# Patient Record
Sex: Female | Born: 1970 | Hispanic: Yes | Marital: Married | Smoking: Never smoker
Health system: Southern US, Community
[De-identification: ages and names within clinical notes are randomized; demographics above are authoritative.]

## PROBLEM LIST (undated history)

## (undated) DIAGNOSIS — E119 Type 2 diabetes mellitus without complications: Secondary | ICD-10-CM

## (undated) DIAGNOSIS — D332 Benign neoplasm of brain, unspecified: Secondary | ICD-10-CM

## (undated) HISTORY — PX: CHOLECYSTECTOMY: SHX55

---

## 2009-02-25 ENCOUNTER — Emergency Department: Payer: Self-pay | Admitting: Emergency Medicine

## 2009-12-14 ENCOUNTER — Emergency Department: Payer: Self-pay | Admitting: Emergency Medicine

## 2010-01-04 ENCOUNTER — Ambulatory Visit: Payer: Self-pay

## 2010-12-14 ENCOUNTER — Observation Stay: Payer: Self-pay | Admitting: Internal Medicine

## 2011-05-02 ENCOUNTER — Emergency Department: Payer: Self-pay | Admitting: Emergency Medicine

## 2011-05-02 LAB — COMPREHENSIVE METABOLIC PANEL
BUN: 14 mg/dL (ref 7–18)
Bilirubin,Total: 0.3 mg/dL (ref 0.2–1.0)
Chloride: 100 mmol/L (ref 98–107)
Creatinine: 0.7 mg/dL (ref 0.60–1.30)
EGFR (African American): >60
Osmolality: 286 (ref 275–301)
Potassium: 4 mmol/L (ref 3.5–5.1)
SGOT(AST): 23 U/L (ref 15–37)
SGPT (ALT): 25 U/L
Sodium: 139 mmol/L (ref 136–145)
Total Protein: 8.1 g/dL (ref 6.4–8.2)

## 2011-05-02 LAB — URINALYSIS, COMPLETE
Bilirubin,UR: NEGATIVE
Glucose,UR: >=500 mg/dL (ref 0–75)
Ph: 6 (ref 4.5–8.0)
Specific Gravity: 1.023 (ref 1.003–1.030)

## 2011-05-02 LAB — CBC
Platelet: 226 10*3/uL (ref 150–440)
RBC: 5 10*6/uL (ref 3.80–5.20)
RDW: 13.5 % (ref 11.5–14.5)
WBC: 8.7 10*3/uL (ref 3.6–11.0)

## 2011-05-02 LAB — WET PREP, GENITAL

## 2011-07-12 ENCOUNTER — Emergency Department: Payer: Self-pay | Admitting: *Deleted

## 2011-07-12 LAB — COMPREHENSIVE METABOLIC PANEL
Alkaline Phosphatase: 104 U/L (ref 50–136)
Anion Gap: 12 (ref 7–16)
Bilirubin,Total: 0.2 mg/dL (ref 0.2–1.0)
Chloride: 107 mmol/L (ref 98–107)
Creatinine: 0.68 mg/dL (ref 0.60–1.30)
EGFR (African American): >60
Glucose: 237 mg/dL — ABNORMAL HIGH (ref 65–99)
Potassium: 3.9 mmol/L (ref 3.5–5.1)
SGOT(AST): 23 U/L (ref 15–37)
SGPT (ALT): 26 U/L
Sodium: 139 mmol/L (ref 136–145)

## 2011-07-12 LAB — CBC WITH DIFFERENTIAL/PLATELET
Basophil #: 0 10*3/uL (ref 0.0–0.1)
Basophil %: 0.2 %
Eosinophil #: 0.1 10*3/uL (ref 0.0–0.7)
Eosinophil %: 1 %
HCT: 37.4 % (ref 35.0–47.0)
HGB: 12.7 g/dL (ref 12.0–16.0)
Lymphocyte #: 2.4 10*3/uL (ref 1.0–3.6)
Lymphocyte %: 34.1 %
MCH: 28.2 pg (ref 26.0–34.0)
MCHC: 34.1 g/dL (ref 32.0–36.0)
MCV: 83 fL (ref 80–100)
Monocyte #: 0.5 10*3/uL (ref 0.0–0.7)
Monocyte %: 7.8 %
Neutrophil #: 4 10*3/uL (ref 1.4–6.5)
Neutrophil %: 56.9 %
Platelet: 208 10*3/uL (ref 150–440)
RBC: 4.52 10*6/uL (ref 3.80–5.20)
RDW: 13.8 % (ref 11.5–14.5)
WBC: 7 10*3/uL (ref 3.6–11.0)

## 2011-07-12 LAB — URINALYSIS, COMPLETE
Bilirubin,UR: NEGATIVE
Glucose,UR: >=500 mg/dL (ref 0–75)
Leukocyte Esterase: NEGATIVE
Nitrite: NEGATIVE
Ph: 5 (ref 4.5–8.0)
RBC,UR: 1 /HPF (ref 0–5)
Specific Gravity: 1.03 (ref 1.003–1.030)
WBC UR: 3 /HPF (ref 0–5)

## 2011-10-12 ENCOUNTER — Emergency Department: Payer: Self-pay | Admitting: Emergency Medicine

## 2012-01-11 ENCOUNTER — Ambulatory Visit: Payer: Self-pay | Admitting: Internal Medicine

## 2012-02-04 ENCOUNTER — Ambulatory Visit: Payer: Self-pay | Admitting: Neurology

## 2012-02-11 ENCOUNTER — Ambulatory Visit: Payer: Self-pay | Admitting: Neurology

## 2012-02-19 ENCOUNTER — Ambulatory Visit: Payer: Self-pay | Admitting: Urology

## 2012-10-02 ENCOUNTER — Emergency Department: Payer: Self-pay | Admitting: Emergency Medicine

## 2012-10-04 ENCOUNTER — Emergency Department: Payer: Self-pay | Admitting: Emergency Medicine

## 2013-03-28 ENCOUNTER — Emergency Department: Payer: Self-pay | Admitting: Emergency Medicine

## 2013-03-28 LAB — COMPREHENSIVE METABOLIC PANEL
Albumin: 3.3 g/dL — ABNORMAL LOW (ref 3.4–5.0)
Bilirubin,Total: 0.2 mg/dL (ref 0.2–1.0)
Chloride: 105 mmol/L (ref 98–107)
Co2: 25 mmol/L (ref 21–32)
Creatinine: 0.68 mg/dL (ref 0.60–1.30)
EGFR (African American): >60
Osmolality: 277 (ref 275–301)
SGOT(AST): 15 U/L (ref 15–37)
SGPT (ALT): 28 U/L (ref 12–78)
Sodium: 136 mmol/L (ref 136–145)
Total Protein: 7.7 g/dL (ref 6.4–8.2)

## 2013-03-28 LAB — URINALYSIS, COMPLETE
Bilirubin,UR: NEGATIVE
Glucose,UR: 50 mg/dL (ref 0–75)
Leukocyte Esterase: NEGATIVE
Nitrite: NEGATIVE
Protein: NEGATIVE
RBC,UR: NONE SEEN /HPF (ref 0–5)
Squamous Epithelial: 1

## 2013-03-28 LAB — CBC WITH DIFFERENTIAL/PLATELET
Basophil #: 0 10*3/uL (ref 0.0–0.1)
Basophil %: 0.4 %
Eosinophil #: 0.1 10*3/uL (ref 0.0–0.7)
Eosinophil %: 1 %
Lymphocyte #: 2.6 10*3/uL (ref 1.0–3.6)
MCH: 24.8 pg — ABNORMAL LOW (ref 26.0–34.0)
MCV: 77 fL — ABNORMAL LOW (ref 80–100)
Monocyte %: 7.1 %
Neutrophil #: 4.6 10*3/uL (ref 1.4–6.5)
Neutrophil %: 58.3 %
Platelet: 265 10*3/uL (ref 150–440)
RDW: 15.5 % — ABNORMAL HIGH (ref 11.5–14.5)

## 2013-04-01 ENCOUNTER — Emergency Department: Payer: Self-pay | Admitting: Emergency Medicine

## 2013-04-01 LAB — CBC WITH DIFFERENTIAL/PLATELET
Basophil #: 0 10*3/uL (ref 0.0–0.1)
HGB: 11.9 g/dL — ABNORMAL LOW (ref 12.0–16.0)
Lymphocyte #: 3.3 10*3/uL (ref 1.0–3.6)
MCV: 77 fL — ABNORMAL LOW (ref 80–100)
Monocyte #: 0.5 x10 3/mm (ref 0.2–0.9)
Neutrophil #: 4.3 10*3/uL (ref 1.4–6.5)
Platelet: 280 10*3/uL (ref 150–440)
RBC: 4.68 10*6/uL (ref 3.80–5.20)
RDW: 15.3 % — ABNORMAL HIGH (ref 11.5–14.5)
WBC: 8.2 10*3/uL (ref 3.6–11.0)

## 2013-04-01 LAB — COMPREHENSIVE METABOLIC PANEL
Albumin: 3.3 g/dL — ABNORMAL LOW (ref 3.4–5.0)
Alkaline Phosphatase: 109 U/L
Calcium, Total: 9.1 mg/dL (ref 8.5–10.1)
Chloride: 102 mmol/L (ref 98–107)
Co2: 24 mmol/L (ref 21–32)
Creatinine: 0.72 mg/dL (ref 0.60–1.30)
EGFR (African American): >60
Glucose: 234 mg/dL — ABNORMAL HIGH (ref 65–99)
Potassium: 3.9 mmol/L (ref 3.5–5.1)
SGOT(AST): 24 U/L (ref 15–37)
SGPT (ALT): 34 U/L (ref 12–78)
Sodium: 133 mmol/L — ABNORMAL LOW (ref 136–145)
Total Protein: 8 g/dL (ref 6.4–8.2)

## 2013-04-01 LAB — URINALYSIS, COMPLETE
Leukocyte Esterase: NEGATIVE
Nitrite: NEGATIVE
Protein: NEGATIVE
RBC,UR: 1 /HPF (ref 0–5)
Specific Gravity: 1.014 (ref 1.003–1.030)
Squamous Epithelial: 1
WBC UR: 2 /HPF (ref 0–5)

## 2013-04-01 LAB — LIPASE, BLOOD: Lipase: 122 U/L (ref 73–393)

## 2013-07-22 ENCOUNTER — Emergency Department: Payer: Self-pay | Admitting: Emergency Medicine

## 2013-07-22 LAB — BASIC METABOLIC PANEL
Anion Gap: 8 (ref 7–16)
BUN: 12 mg/dL (ref 7–18)
CALCIUM: 9.3 mg/dL (ref 8.5–10.1)
CHLORIDE: 102 mmol/L (ref 98–107)
CO2: 25 mmol/L (ref 21–32)
Creatinine: 0.68 mg/dL (ref 0.60–1.30)
EGFR (African American): >60
EGFR (Non-African Amer.): >60
Glucose: 170 mg/dL — ABNORMAL HIGH (ref 65–99)
Osmolality: 274 (ref 275–301)
Potassium: 4.1 mmol/L (ref 3.5–5.1)
Sodium: 135 mmol/L — ABNORMAL LOW (ref 136–145)

## 2013-07-22 LAB — CBC
HCT: 35.9 % (ref 35.0–47.0)
HGB: 11.7 g/dL — AB (ref 12.0–16.0)
MCH: 25.4 pg — ABNORMAL LOW (ref 26.0–34.0)
MCHC: 32.5 g/dL (ref 32.0–36.0)
MCV: 78 fL — ABNORMAL LOW (ref 80–100)
Platelet: 275 10*3/uL (ref 150–440)
RBC: 4.59 10*6/uL (ref 3.80–5.20)
RDW: 15.2 % — AB (ref 11.5–14.5)
WBC: 8.5 10*3/uL (ref 3.6–11.0)

## 2013-07-22 LAB — TROPONIN I: Troponin-I: <0.02 ng/mL

## 2013-07-22 LAB — PRO B NATRIURETIC PEPTIDE: B-TYPE NATIURETIC PEPTID: 12 pg/mL (ref 0–125)

## 2013-09-13 ENCOUNTER — Emergency Department: Payer: Self-pay | Admitting: Emergency Medicine

## 2013-09-13 LAB — COMPREHENSIVE METABOLIC PANEL
AST: 22 U/L (ref 15–37)
Albumin: 3.3 g/dL — ABNORMAL LOW (ref 3.4–5.0)
Alkaline Phosphatase: 105 U/L
Anion Gap: 5 — ABNORMAL LOW (ref 7–16)
BUN: 6 mg/dL — AB (ref 7–18)
Bilirubin,Total: 0.2 mg/dL (ref 0.2–1.0)
CHLORIDE: 102 mmol/L (ref 98–107)
Calcium, Total: 8.6 mg/dL (ref 8.5–10.1)
Co2: 27 mmol/L (ref 21–32)
Creatinine: 0.55 mg/dL — ABNORMAL LOW (ref 0.60–1.30)
Glucose: 279 mg/dL — ABNORMAL HIGH (ref 65–99)
Osmolality: 276 (ref 275–301)
Potassium: 3.6 mmol/L (ref 3.5–5.1)
SGPT (ALT): 29 U/L (ref 12–78)
Sodium: 134 mmol/L — ABNORMAL LOW (ref 136–145)
TOTAL PROTEIN: 7.8 g/dL (ref 6.4–8.2)

## 2013-09-13 LAB — CBC WITH DIFFERENTIAL/PLATELET
Basophil #: 0 10*3/uL (ref 0.0–0.1)
Basophil %: 0.4 %
EOS ABS: 0.1 10*3/uL (ref 0.0–0.7)
Eosinophil %: 0.5 %
HCT: 35.8 % (ref 35.0–47.0)
HGB: 11.5 g/dL — ABNORMAL LOW (ref 12.0–16.0)
Lymphocyte #: 2.7 10*3/uL (ref 1.0–3.6)
Lymphocyte %: 25.3 %
MCH: 25.1 pg — AB (ref 26.0–34.0)
MCHC: 32.2 g/dL (ref 32.0–36.0)
MCV: 78 fL — ABNORMAL LOW (ref 80–100)
Monocyte #: 0.8 x10 3/mm (ref 0.2–0.9)
Monocyte %: 7.6 %
Neutrophil #: 7.2 10*3/uL — ABNORMAL HIGH (ref 1.4–6.5)
Neutrophil %: 66.2 %
Platelet: 282 10*3/uL (ref 150–440)
RBC: 4.59 10*6/uL (ref 3.80–5.20)
RDW: 15.7 % — ABNORMAL HIGH (ref 11.5–14.5)
WBC: 10.8 10*3/uL (ref 3.6–11.0)

## 2013-09-13 LAB — URINALYSIS, COMPLETE
BACTERIA: NONE SEEN
Bilirubin,UR: NEGATIVE
Blood: NEGATIVE
Glucose,UR: >=500 mg/dL (ref 0–75)
Leukocyte Esterase: NEGATIVE
Nitrite: NEGATIVE
PH: 5 (ref 4.5–8.0)
Protein: NEGATIVE
RBC,UR: <1 /HPF (ref 0–5)
SPECIFIC GRAVITY: 1.027 (ref 1.003–1.030)

## 2013-09-13 LAB — LIPASE, BLOOD: LIPASE: 197 U/L (ref 73–393)

## 2013-09-14 LAB — GC/CHLAMYDIA PROBE AMP

## 2013-11-01 ENCOUNTER — Emergency Department: Payer: Self-pay | Admitting: Emergency Medicine

## 2013-11-01 LAB — CBC WITH DIFFERENTIAL/PLATELET
Basophil #: 0 10*3/uL (ref 0.0–0.1)
Basophil %: 0.3 %
EOS PCT: 0.1 %
Eosinophil #: 0 10*3/uL (ref 0.0–0.7)
HCT: 37.6 % (ref 35.0–47.0)
HGB: 12.1 g/dL (ref 12.0–16.0)
Lymphocyte #: 0.9 10*3/uL — ABNORMAL LOW (ref 1.0–3.6)
Lymphocyte %: 12.4 %
MCH: 24.7 pg — AB (ref 26.0–34.0)
MCHC: 32.1 g/dL (ref 32.0–36.0)
MCV: 77 fL — ABNORMAL LOW (ref 80–100)
MONOS PCT: 9.2 %
Monocyte #: 0.7 x10 3/mm (ref 0.2–0.9)
NEUTROS ABS: 5.9 10*3/uL (ref 1.4–6.5)
Neutrophil %: 78 %
Platelet: 242 10*3/uL (ref 150–440)
RBC: 4.9 10*6/uL (ref 3.80–5.20)
RDW: 15.6 % — ABNORMAL HIGH (ref 11.5–14.5)
WBC: 7.6 10*3/uL (ref 3.6–11.0)

## 2013-11-01 LAB — URINALYSIS, COMPLETE
Bilirubin,UR: NEGATIVE
GLUCOSE, UR: NEGATIVE mg/dL (ref 0–75)
Leukocyte Esterase: NEGATIVE
Nitrite: NEGATIVE
Ph: 6 (ref 4.5–8.0)
Protein: 30
Specific Gravity: 1.018 (ref 1.003–1.030)
WBC UR: 4 /HPF (ref 0–5)

## 2013-11-01 LAB — COMPREHENSIVE METABOLIC PANEL
AST: 27 U/L (ref 15–37)
Albumin: 3 g/dL — ABNORMAL LOW (ref 3.4–5.0)
Alkaline Phosphatase: 97 U/L
Anion Gap: 12 (ref 7–16)
BUN: 8 mg/dL (ref 7–18)
Bilirubin,Total: 0.3 mg/dL (ref 0.2–1.0)
CALCIUM: 9.4 mg/dL (ref 8.5–10.1)
CO2: 21 mmol/L (ref 21–32)
CREATININE: 0.74 mg/dL (ref 0.60–1.30)
Chloride: 101 mmol/L (ref 98–107)
EGFR (Non-African Amer.): >60
Glucose: 180 mg/dL — ABNORMAL HIGH (ref 65–99)
OSMOLALITY: 271 (ref 275–301)
POTASSIUM: 3.1 mmol/L — AB (ref 3.5–5.1)
SGPT (ALT): 29 U/L
Sodium: 134 mmol/L — ABNORMAL LOW (ref 136–145)
Total Protein: 8.3 g/dL — ABNORMAL HIGH (ref 6.4–8.2)

## 2013-11-01 LAB — LIPASE, BLOOD: Lipase: 139 U/L (ref 73–393)

## 2013-11-02 ENCOUNTER — Observation Stay: Payer: Self-pay | Admitting: Internal Medicine

## 2013-11-02 LAB — COMPREHENSIVE METABOLIC PANEL
AST: 52 U/L — AB (ref 15–37)
Albumin: 2.8 g/dL — ABNORMAL LOW (ref 3.4–5.0)
Alkaline Phosphatase: 176 U/L — ABNORMAL HIGH
Anion Gap: 10 (ref 7–16)
BILIRUBIN TOTAL: 0.2 mg/dL (ref 0.2–1.0)
BUN: 8 mg/dL (ref 7–18)
CO2: 23 mmol/L (ref 21–32)
Calcium, Total: 8.9 mg/dL (ref 8.5–10.1)
Chloride: 102 mmol/L (ref 98–107)
Creatinine: 0.66 mg/dL (ref 0.60–1.30)
EGFR (Non-African Amer.): >60
GLUCOSE: 193 mg/dL — AB (ref 65–99)
OSMOLALITY: 274 (ref 275–301)
POTASSIUM: 3.3 mmol/L — AB (ref 3.5–5.1)
SGPT (ALT): 75 U/L — ABNORMAL HIGH
Sodium: 135 mmol/L — ABNORMAL LOW (ref 136–145)
Total Protein: 7.9 g/dL (ref 6.4–8.2)

## 2013-11-02 LAB — CBC WITH DIFFERENTIAL/PLATELET
Basophil #: 0 10*3/uL (ref 0.0–0.1)
Basophil %: 0.4 %
EOS PCT: 0.6 %
Eosinophil #: 0 10*3/uL (ref 0.0–0.7)
HCT: 35.5 % (ref 35.0–47.0)
HGB: 11.4 g/dL — ABNORMAL LOW (ref 12.0–16.0)
LYMPHS ABS: 1.1 10*3/uL (ref 1.0–3.6)
Lymphocyte %: 17.2 %
MCH: 24.8 pg — ABNORMAL LOW (ref 26.0–34.0)
MCHC: 32.2 g/dL (ref 32.0–36.0)
MCV: 77 fL — AB (ref 80–100)
MONOS PCT: 15.4 %
Monocyte #: 1 x10 3/mm — ABNORMAL HIGH (ref 0.2–0.9)
Neutrophil #: 4.3 10*3/uL (ref 1.4–6.5)
Neutrophil %: 66.4 %
Platelet: 253 10*3/uL (ref 150–440)
RBC: 4.59 10*6/uL (ref 3.80–5.20)
RDW: 15.4 % — ABNORMAL HIGH (ref 11.5–14.5)
WBC: 6.4 10*3/uL (ref 3.6–11.0)

## 2013-11-03 LAB — URINALYSIS, COMPLETE
BACTERIA: NONE SEEN
BLOOD: NEGATIVE
Bilirubin,UR: NEGATIVE
Glucose,UR: NEGATIVE mg/dL (ref 0–75)
LEUKOCYTE ESTERASE: NEGATIVE
NITRITE: NEGATIVE
Ph: 6 (ref 4.5–8.0)
Protein: NEGATIVE
RBC,UR: 1 /HPF (ref 0–5)
SPECIFIC GRAVITY: 1.014 (ref 1.003–1.030)
Squamous Epithelial: 1

## 2013-11-03 LAB — BASIC METABOLIC PANEL
Anion Gap: 8 (ref 7–16)
BUN: 7 mg/dL (ref 7–18)
CHLORIDE: 107 mmol/L (ref 98–107)
CREATININE: 0.72 mg/dL (ref 0.60–1.30)
Calcium, Total: 8.4 mg/dL — ABNORMAL LOW (ref 8.5–10.1)
Co2: 23 mmol/L (ref 21–32)
EGFR (African American): >60
Glucose: 148 mg/dL — ABNORMAL HIGH (ref 65–99)
OSMOLALITY: 276 (ref 275–301)
POTASSIUM: 3.1 mmol/L — AB (ref 3.5–5.1)
Sodium: 138 mmol/L (ref 136–145)

## 2013-11-03 LAB — CBC WITH DIFFERENTIAL/PLATELET
Basophil #: 0 10*3/uL (ref 0.0–0.1)
Basophil %: 0.4 %
Eosinophil #: 0 10*3/uL (ref 0.0–0.7)
Eosinophil %: 0.7 %
HCT: 31 % — AB (ref 35.0–47.0)
HGB: 10.1 g/dL — ABNORMAL LOW (ref 12.0–16.0)
LYMPHS PCT: 33.2 %
Lymphocyte #: 1.5 10*3/uL (ref 1.0–3.6)
MCH: 24.6 pg — AB (ref 26.0–34.0)
MCHC: 32.5 g/dL (ref 32.0–36.0)
MCV: 76 fL — AB (ref 80–100)
MONOS PCT: 20.2 %
Monocyte #: 0.9 x10 3/mm (ref 0.2–0.9)
NEUTROS ABS: 2.1 10*3/uL (ref 1.4–6.5)
NEUTROS PCT: 45.5 %
PLATELETS: 221 10*3/uL (ref 150–440)
RBC: 4.09 10*6/uL (ref 3.80–5.20)
RDW: 15.5 % — AB (ref 11.5–14.5)
WBC: 4.5 10*3/uL (ref 3.6–11.0)

## 2014-02-21 ENCOUNTER — Emergency Department: Payer: Self-pay | Admitting: Emergency Medicine

## 2014-03-19 ENCOUNTER — Emergency Department: Payer: Self-pay | Admitting: Emergency Medicine

## 2014-03-19 LAB — COMPREHENSIVE METABOLIC PANEL
ALK PHOS: 102 U/L
ANION GAP: 8 (ref 7–16)
Albumin: 2.9 g/dL — ABNORMAL LOW (ref 3.4–5.0)
BUN: 8 mg/dL (ref 7–18)
Bilirubin,Total: 0.1 mg/dL — ABNORMAL LOW (ref 0.2–1.0)
Calcium, Total: 8.7 mg/dL (ref 8.5–10.1)
Chloride: 107 mmol/L (ref 98–107)
Co2: 23 mmol/L (ref 21–32)
Creatinine: 0.77 mg/dL (ref 0.60–1.30)
EGFR (African American): >60
EGFR (Non-African Amer.): >60
Glucose: 191 mg/dL — ABNORMAL HIGH (ref 65–99)
Osmolality: 279 (ref 275–301)
Potassium: 4.1 mmol/L (ref 3.5–5.1)
SGOT(AST): 23 U/L (ref 15–37)
SGPT (ALT): 31 U/L
SODIUM: 138 mmol/L (ref 136–145)
Total Protein: 7.4 g/dL (ref 6.4–8.2)

## 2014-03-19 LAB — CBC
HCT: 32.9 % — AB (ref 35.0–47.0)
HGB: 10.2 g/dL — ABNORMAL LOW (ref 12.0–16.0)
MCH: 23.1 pg — AB (ref 26.0–34.0)
MCHC: 31 g/dL — AB (ref 32.0–36.0)
MCV: 74 fL — AB (ref 80–100)
Platelet: 282 10*3/uL (ref 150–440)
RBC: 4.42 10*6/uL (ref 3.80–5.20)
RDW: 16.1 % — AB (ref 11.5–14.5)
WBC: 10.7 10*3/uL (ref 3.6–11.0)

## 2014-03-19 LAB — URINALYSIS, COMPLETE
BLOOD: NEGATIVE
Bilirubin,UR: NEGATIVE
GLUCOSE, UR: NEGATIVE mg/dL (ref 0–75)
KETONE: NEGATIVE
Leukocyte Esterase: NEGATIVE
NITRITE: NEGATIVE
Ph: 5 (ref 4.5–8.0)
Protein: NEGATIVE
RBC,UR: NONE SEEN /HPF (ref 0–5)
Specific Gravity: 1.021 (ref 1.003–1.030)
Squamous Epithelial: 15
WBC UR: NONE SEEN /HPF (ref 0–5)

## 2014-04-13 ENCOUNTER — Emergency Department: Payer: Self-pay | Admitting: Emergency Medicine

## 2014-04-13 LAB — CBC
HCT: 35.7 % (ref 35.0–47.0)
HGB: 11.2 g/dL — AB (ref 12.0–16.0)
MCH: 22.8 pg — ABNORMAL LOW (ref 26.0–34.0)
MCHC: 31.3 g/dL — ABNORMAL LOW (ref 32.0–36.0)
MCV: 73 fL — AB (ref 80–100)
Platelet: 306 10*3/uL (ref 150–440)
RBC: 4.9 10*6/uL (ref 3.80–5.20)
RDW: 16.4 % — ABNORMAL HIGH (ref 11.5–14.5)
WBC: 9.5 10*3/uL (ref 3.6–11.0)

## 2014-04-13 LAB — COMPREHENSIVE METABOLIC PANEL
ALK PHOS: 119 U/L — AB
ALT: 30 U/L
AST: 18 U/L (ref 15–37)
Albumin: 3.6 g/dL (ref 3.4–5.0)
Anion Gap: 9 (ref 7–16)
BUN: 10 mg/dL (ref 7–18)
Bilirubin,Total: 0.1 mg/dL — ABNORMAL LOW (ref 0.2–1.0)
CREATININE: 0.65 mg/dL (ref 0.60–1.30)
Calcium, Total: 8.9 mg/dL (ref 8.5–10.1)
Chloride: 103 mmol/L (ref 98–107)
Co2: 25 mmol/L (ref 21–32)
EGFR (African American): >60
EGFR (Non-African Amer.): >60
GLUCOSE: 150 mg/dL — AB (ref 65–99)
Osmolality: 276 (ref 275–301)
Potassium: 3.5 mmol/L (ref 3.5–5.1)
Sodium: 137 mmol/L (ref 136–145)
Total Protein: 8.5 g/dL — ABNORMAL HIGH (ref 6.4–8.2)

## 2014-04-13 LAB — LIPASE, BLOOD: Lipase: 195 U/L (ref 73–393)

## 2014-04-13 LAB — HCG, QUANTITATIVE, PREGNANCY: Beta Hcg, Quant.: <1 m[IU]/mL — ABNORMAL LOW

## 2014-04-14 LAB — URINALYSIS, COMPLETE
Bacteria: NONE SEEN
Bilirubin,UR: NEGATIVE
Blood: NEGATIVE
Glucose,UR: NEGATIVE mg/dL (ref 0–75)
Ketone: NEGATIVE
Leukocyte Esterase: NEGATIVE
NITRITE: NEGATIVE
Ph: 5 (ref 4.5–8.0)
Protein: NEGATIVE
RBC,UR: 2 /HPF (ref 0–5)
SPECIFIC GRAVITY: 1.016 (ref 1.003–1.030)
Squamous Epithelial: 1
WBC UR: 1 /HPF (ref 0–5)

## 2014-07-14 ENCOUNTER — Emergency Department
Admit: 2014-07-14 | Disposition: A | Discharge status: Home / Self Care | Payer: Self-pay | Admitting: Emergency Medicine

## 2014-07-14 LAB — URINALYSIS, COMPLETE
BLOOD: NEGATIVE
Bacteria: NONE SEEN
Bilirubin,UR: NEGATIVE
Glucose,UR: NEGATIVE mg/dL (ref 0–75)
Ketone: NEGATIVE
Leukocyte Esterase: NEGATIVE
NITRITE: NEGATIVE
Ph: 5 (ref 4.5–8.0)
Protein: NEGATIVE
SPECIFIC GRAVITY: 1.016 (ref 1.003–1.030)

## 2014-07-14 LAB — CBC WITH DIFFERENTIAL/PLATELET
BASOS PCT: 0.3 %
Basophil #: 0 10*3/uL (ref 0.0–0.1)
EOS PCT: 1.3 %
Eosinophil #: 0.1 10*3/uL (ref 0.0–0.7)
HCT: 31.2 % — ABNORMAL LOW (ref 35.0–47.0)
HGB: 9.5 g/dL — AB (ref 12.0–16.0)
LYMPHS ABS: 3 10*3/uL (ref 1.0–3.6)
Lymphocyte %: 34.7 %
MCH: 21.2 pg — ABNORMAL LOW (ref 26.0–34.0)
MCHC: 30.5 g/dL — ABNORMAL LOW (ref 32.0–36.0)
MCV: 70 fL — ABNORMAL LOW (ref 80–100)
MONO ABS: 0.7 x10 3/mm (ref 0.2–0.9)
Monocyte %: 8.2 %
Neutrophil #: 4.8 10*3/uL (ref 1.4–6.5)
Neutrophil %: 55.5 %
Platelet: 324 10*3/uL (ref 150–440)
RBC: 4.49 10*6/uL (ref 3.80–5.20)
RDW: 17.1 % — ABNORMAL HIGH (ref 11.5–14.5)
WBC: 8.7 10*3/uL (ref 3.6–11.0)

## 2014-07-14 LAB — COMPREHENSIVE METABOLIC PANEL
ALBUMIN: 3.4 g/dL — AB
ALK PHOS: 90 U/L
AST: 24 U/L
Anion Gap: 9 (ref 7–16)
BUN: 10 mg/dL
Bilirubin,Total: 0.5 mg/dL
CHLORIDE: 101 mmol/L
Calcium, Total: 9.3 mg/dL
Co2: 25 mmol/L
Creatinine: 0.62 mg/dL
EGFR (African American): >60
EGFR (Non-African Amer.): >60
Glucose: 189 mg/dL — ABNORMAL HIGH
POTASSIUM: 3.9 mmol/L
SGPT (ALT): 21 U/L
Sodium: 135 mmol/L
Total Protein: 7.6 g/dL

## 2014-07-14 LAB — PROTIME-INR
INR: 1
Prothrombin Time: 12.9 secs

## 2014-07-30 NOTE — H&P (Signed)
PATIENT NAME:  Tina Blackburn MR#:  [id number] DATE OF BIRTH:  08/20/1970  DATE OF ADMISSION:  11/02/2013  PRIMARY CARE PHYSICIAN: Dr. Benjamine Mola Arru.    REFERRING PHYSICIAN:  Vivien Presto, Physician Assistant.   CHIEF COMPLAINT: Nausea, vomiting and diarrhea, bloody stools.   HISTORY OF PRESENT ILLNESS:  [name] is a 44 year old Hispanic female, Spanish only speaking, history of diabetes mellitus, comes to the Emergency Department with complaints of nausea and vomiting for the last 5 days. The patient states had multiple episodes of vomiting. Since Friday, only has been nauseated; did not have any episodes of vomiting. The patient states has been having multiple episodes of diarrhea. Had some subjective fevers. Concerning this, came to the Emergency Department 2 days back. CT abdomen and pelvis showed mild colitis. The patient was discharged home with Cipro and Flagyl. Did not have any elevated white blood cell count. Did not have any signs of any dehydration. The patient states unable to keep down any of the food or the pills. Concerning this, came back to the Emergency Department. The patient also states that has been having bloody diarrhea since this morning. Denies having any sick contacts, eating anywhere outside or leftover food. Stool occult was positive, however, more of brownish stools.   PAST MEDICAL HISTORY: 1.  Diabetes mellitus.  2.  Kidney stones.   PAST SURGICAL HISTORY:  Cholecystectomy.   ALLERGIES: No known drug allergies.   HOME MEDICATIONS:  None.   SOCIAL HISTORY:  No history of smoking, drinking alcohol or using illicit drugs. Married, lives with her husband.   FAMILY HISTORY:  Diabetes mellitus.  REVIEW OF SYSTEMS: CONSTITUTIONAL:  Experiencing generalized weakness.  EYES:  No change in vision.  EAR, NOSE, THROAT:  No change in her hearing.  RESPIRATORY:  No cough, shortness of breath.  CARDIOVASCULAR:  No chest pain, palpations.   GASTROINTESTINAL:  Has nausea, vomiting, diarrhea.  GENITOURINARY:  No dysuria or hematuria.  HEMATOLOGIC:  No easy bruising or bleeding.  SKIN:  No rash or lesions.  MUSCULOSKELETAL:  No joint pains and aches.  NEUROLOGIC:  No weakness or numbness in any part of the body.   PHYSICAL EXAMINATION: GENERAL:  This is a well-built, well-nourished, age-appropriate female lying down in the bed, not in distress.  VITAL SIGNS:  Temperature 98.5, pulse 99, blood pressure 111/89, respiratory rate of 16, oxygen saturation 100% on room air.  HEENT:  Head normocephalic, atraumatic. Eyes: No scleral icterus. Conjunctivae normal. Pupils equal and react to light. Extraocular movements are intact. Mucous membranes moist. No pharyngeal erythema.  NECK:  Supple. No lymphadenopathy. No JVD. No carotid bruit.  CHEST:  No focal tenderness.  LUNGS:  Bilateral clear to auscultation.  HEART:  S1, S2 regular. No murmurs are heard.  ABDOMEN:  Bowel sounds present. Soft, nontender, nondistended. Could not appreciate any hepatosplenomegaly.  EXTREMITIES:  No pedal edema. Pulses 2+.  SKIN:  No rash or lesions.  MUSCULOSKELETAL:  Good range of motion in all the extremities.  NEUROLOGIC:  The patient is alert, oriented to place, person and time. Cranial nerves II through XII intact. Motor 5/5 in upper and lower extremities.   LABORATORY DATA: CBC is completely within normal limits. CMP: Glucose 180, potassium 3.1. Has mild elevation of the LFTs with alk phos of 176, ALT 75, AST 52. UA negative for nitrites and leukocyte esterase.   CT abdomen and pelvis with contrast shows suspected mild colitis involving the descending and sigmoid colon, prominent liver with fatty liver  change. Gallbladder absent.   ASSESSMENT AND PLAN: [name] is a 44 year old female who comes to the Emergency Department with nausea, vomiting and diarrhea.  1.  Gastroenteritis. Keep the patient n.p.o. Continue with IV fluids with potassium and  have a close followup. We will also keep the patient on Cipro and Flagyl considering the patient's mild colitis.  2.  Mildly elevated liver enzymes, which is a change from the previous labs done 2 days back. Repeat the CMP tomorrow and follow up.  If continues to be elevated, consider getting a hepatitis panel.  3.  Diabetes mellitus. The patient has blood sugars of 193. The patient may benefit starting on metformin, considering the patient's obesity and fatty liver.  4.  Keep the patient on deep vein thrombosis prophylaxis with sequential compression devices.   TIME SPENT: 50 minutes.     ____________________________ Monica Becton, MD pv:dmm D: 11/02/2013 22:17:00 ET T: 11/02/2013 23:04:25 ET JOB#: [id number]  cc: Monica Becton, MD, <Dictator> Dani Gobble. Elon Jester Elodia Haviland MD ELECTRONICALLY SIGNED 11/11/2013 21:10

## 2014-07-30 NOTE — Discharge Summary (Signed)
PATIENT NAME:  Tina Blackburn, Tina Blackburn MR#:  [id number] DATE OF BIRTH:  1970-08-31  DATE OF ADMISSION:  11/02/2013 DATE OF DISCHARGE:  11/04/2013  PRESENTING COMPLAINT: Nausea, vomiting and bloody stools.   DISCHARGE DIAGNOSES: 1.  Acute colitis.  2.  Type 2 diabetes.  3.  Morbid obesity.   CODE STATUS: FULL.  DISCHARGE MEDICATIONS: 1.  Cipro 500 mg b.i.d.  2.  Flagyl 500 mg t.i.d.  3.  Metformin 1,000 mg b.i.d.  4.  Januvia 100 mg daily.   DISCHARGE DIET: Carbohydrate-controlled.  DISCHARGE INSTRUCTIONS: Follow up with your primary care physician at the [facility].   DIAGNOSTIC DATA: White count is 4.5. Creatinine is 0.72.   CT of the abdomen and pelvis with contrast showed suspect mild colitis involving descending and sigmoid colon. No abscess was seen  UA positive for mild UTI.  BRIEF SUMMARY OF HOSPITAL COURSE: Tina Blackburn is an obese 44 year old Hispanic female with history of diabetes who came in with nausea, vomiting and bloody stools. She was admitted with:  1.  Acute colitis. Received IV fluids and placed on IV Cipro and Flagyl. She was tolerating p.o. soft diet. No abdominal pain at discharge. She will finish up a 10 day course of antibiotics.  2.  Mild elevated liver enzymes, which have been unchanged from labs done 2 days back. She had her gallbladder removed in the past. CT of abdomen showed fatty liver. The patient did not have any symptoms related to that.  3.  Type 2 diabetes. The patient was resumed back on her Januvia and metformin at discharge.  Hospital stay otherwise remained stable. She remained a FULL code.   TIME SPENT: 40 minutes.  ____________________________ [name], MD sap:sb D: 11/05/2013 07:58:11 ET T: 11/05/2013 09:51:19 ET JOB#: [id number]  cc: Amada Hallisey A. Posey Pronto, MD, <Dictator> Ilda Basset MD ELECTRONICALLY SIGNED 11/17/2013 11:38

## 2014-08-16 NOTE — H&P (Signed)
L&D Evaluation:  History Expanded:  HPI 44 yo female with several month history of upper abdominal pain, worse the past two days.  The pain started in her upper abdomen and then spread to her lower abdomen, but did not leave her epigastric area.  Notably, she is being treated for infectious gastritis.  She has an IUD in place,which was placed seven months ago.  This was placed due to irregular vaginal bleeding.  She will have periods that last for a couple of weeks at a time, then go away and return a few days later.  She is not having vaginal bleeding today.  She states that she is nauseated, but isn't vomiting. She has had long-standing diarrhea of unknown origin and states that she is having rectal bleeding recently.  Her pain is made worse by eating. Nothing makes it better. She is taking clarithromycin and amoxicillin for gastritis.  She is also taking omeprazole and maloox for her reflux symptoms.  She strongly desires  to have her IUD removed.   Patient's Medical History 1) gastritis, 2) GERD, 3) Type 2 diabetes 4) obesity   Patient's Surgical History laparoscopic cholecystectomy   Medications 1) metformin 1 gram po BID, 2) clarithromycin 527m PO BID, 3) Amoxicillin 1,0042mPO BID, 4) omeprazole 4037mO daily, 5) Januvia 100m32m daily   Allergies NKDA   Social History denies tobacco, EtOH, and illicit drugs   Family History Non-Contributory   ROS:  ROS All systems were reviewed.  HEENT, CNS, GI, GU, Respiratory, CV, Renal and Musculoskeletal systems were found to be normal., unless otherwise noted in her HPI   Exam:  Vital Signs Afebrile, P 88, BP 121/57, RR20   General Obese, NAD   Mental Status clear   Chest clear   Heart normal sinus rhythm   Abdomen obese, soft, tender to palpation throughout (mild), no rebound or guarding   Back no CVAT   Edema no edema   Pelvic normal external female genitalia, vaginal normally rugated with scant blood in the vaginal vault,  cervix is normal in appearance, she does have moderate cervical motion tenderness, her body habitus limits evaluation of her uterine contour and adnexa.  Her IUD strings are easily visible emerging from the cervical os.   Skin no lesions   Other Pelvic U/S and abdominal/pelvic CT scan shows small fibroids, no other structural abnormalities, no free fluid. IUD located in the lower uterine segment with concern for arm of IUD extending into the myometrium.   Impression:  Impression 1) abdominal pain, acute, 2) malpositioned IUD   Plan:  Comments 1) IUD removed without difficulty 2) treat for PID with rocephine 250mg76mx 1, doxycycline 100mg 29mid x 2 weeks. [facility] bleeding. 4) her current issues appear to be related to her gastritis/GI issues.  A CT scan in 03/2013 showed the IUD in a similar position. So, the IUD location has not changed and is unlikely to account for any acute pain.   Labs:  Lab Results: Hepatic:  08-Jun-15 14:00   Bilirubin, Total 0.2  Alkaline Phosphatase 105 (45-117 NOTE: New Reference Range 02/26/13)  SGPT (ALT) 29  SGOT (AST) 22  Total Protein, Serum 7.8  Albumin, Serum  3.3  Routine Chem:  08-Jun-15 14:00   Lipase 197 (Result(s) reported on 13 Sep 2013 at 03:28PM.)  Glucose, Serum  279  BUN  6  Creatinine (comp)  0.55  Potassium, Serum 3.6  Chloride, Serum 102  CO2, Serum 27  Calcium (Total), Serum 8.6  Osmolality (calc) 276  eGFR (African American) >60  eGFR (Non-African American) >60 (eGFR values <28m/min/1.73 m2 may be an indication of chronic kidney disease (CKD). Calculated eGFR is useful in patients with stable renal function. The eGFR calculation will not be reliable in acutely ill patients when serum creatinine is changing rapidly. It is not useful in  patients on dialysis. The eGFR calculation may not be applicable to patients at the low and high extremes of body  sizes, pregnant women, and vegetarians.)  Anion Gap  5  Routine UA:  08-Jun-15 14:00   Color (UA) Yellow  Clarity (UA) Clear  Glucose (UA) >=500  Bilirubin (UA) Negative  Ketones (UA) Trace  Specific Gravity (UA) 1.027  Blood (UA) Negative  pH (UA) 5.0  Protein (UA) Negative  Nitrite (UA) Negative  Leukocyte Esterase (UA) Negative (Result(s) reported on 13 Sep 2013 at 02:55PM.)  RBC (UA) <1 /HPF  WBC (UA) 1 /HPF  Bacteria (UA) NONE SEEN  Epithelial Cells (UA) 2 /HPF  Mucous (UA) PRESENT (Result(s) reported on 13 Sep 2013 at 02:55PM.)  Routine Hem:  08-Jun-15 14:00   WBC (CBC) 10.8  RBC (CBC) 4.59  Hemoglobin (CBC)  11.5  Hematocrit (CBC) 35.8  Platelet Count (CBC) 282  MCV  78  MCH  25.1  MCHC 32.2  RDW  15.7  Neutrophil % 66.2  Lymphocyte % 25.3  Monocyte % 7.6  Eosinophil % 0.5  Basophil % 0.4  Neutrophil #  7.2  Lymphocyte # 2.7  Monocyte # 0.8  Eosinophil # 0.1  Basophil # 0.0 (Result(s) reported on 13 Sep 2013 at 02:30PM.)   Electronic Signatures: JWill Bonnet(MD)  (Signed 08-Jun-15 22:59)  Authored: L&D Evaluation, Labs   Last Updated: 08-Jun-15 22:59 by JWill Bonnet(MD)

## 2014-12-18 ENCOUNTER — Encounter: Payer: Self-pay | Admitting: *Deleted

## 2014-12-18 ENCOUNTER — Emergency Department
Admission: EM | Admit: 2014-12-18 | Discharge: 2014-12-19 | Disposition: A | Discharge status: Home / Self Care | Payer: Self-pay | Attending: Emergency Medicine | Admitting: Emergency Medicine

## 2014-12-18 DIAGNOSIS — Z79899 Other long term (current) drug therapy: Secondary | ICD-10-CM | POA: Insufficient documentation

## 2014-12-18 DIAGNOSIS — R109 Unspecified abdominal pain: Secondary | ICD-10-CM | POA: Insufficient documentation

## 2014-12-18 DIAGNOSIS — E119 Type 2 diabetes mellitus without complications: Secondary | ICD-10-CM | POA: Insufficient documentation

## 2014-12-18 DIAGNOSIS — R309 Painful micturition, unspecified: Secondary | ICD-10-CM | POA: Insufficient documentation

## 2014-12-18 DIAGNOSIS — Z3202 Encounter for pregnancy test, result negative: Secondary | ICD-10-CM | POA: Insufficient documentation

## 2014-12-18 DIAGNOSIS — R319 Hematuria, unspecified: Secondary | ICD-10-CM | POA: Insufficient documentation

## 2014-12-18 HISTORY — DX: Type 2 diabetes mellitus without complications: E11.9

## 2014-12-18 LAB — URINALYSIS COMPLETE WITH MICROSCOPIC (ARMC ONLY)
BILIRUBIN URINE: NEGATIVE
Glucose, UA: >500 mg/dL — AB
KETONES UR: NEGATIVE mg/dL
LEUKOCYTES UA: NEGATIVE
NITRITE: NEGATIVE
PH: 6 (ref 5.0–8.0)
PROTEIN: NEGATIVE mg/dL
Specific Gravity, Urine: 1.025 (ref 1.005–1.030)

## 2014-12-18 LAB — POCT PREGNANCY, URINE: Preg Test, Ur: NEGATIVE

## 2014-12-18 NOTE — ED Notes (Signed)
Pt has low abd pain for 3 days.  Pt has nausea.  states blood in urine.  No fever.  Pt has low back pain.

## 2014-12-18 NOTE — ED Notes (Signed)
Pt c/o dysuria x 2 days. Pt states hematuria starting at 2100 tonight. Pt c/o nausea and diarrhea x 2 days, denies vomiting. Pt denies fever, endorses mid-back pain x 2 days.

## 2014-12-19 ENCOUNTER — Emergency Department: Payer: Self-pay

## 2014-12-19 LAB — CBC
HCT: 32.1 % — ABNORMAL LOW (ref 35.0–47.0)
Hemoglobin: 9.9 g/dL — ABNORMAL LOW (ref 12.0–16.0)
MCH: 20.2 pg — AB (ref 26.0–34.0)
MCHC: 30.8 g/dL — AB (ref 32.0–36.0)
MCV: 65.4 fL — ABNORMAL LOW (ref 80.0–100.0)
PLATELETS: 285 10*3/uL (ref 150–440)
RBC: 4.91 MIL/uL (ref 3.80–5.20)
RDW: 18.1 % — ABNORMAL HIGH (ref 11.5–14.5)
WBC: 8.6 10*3/uL (ref 3.6–11.0)

## 2014-12-19 LAB — BASIC METABOLIC PANEL
Anion gap: 8 (ref 5–15)
BUN: 10 mg/dL (ref 6–20)
CALCIUM: 9.4 mg/dL (ref 8.9–10.3)
CO2: 25 mmol/L (ref 22–32)
CREATININE: 0.55 mg/dL (ref 0.44–1.00)
Chloride: 101 mmol/L (ref 101–111)
Glucose, Bld: 332 mg/dL — ABNORMAL HIGH (ref 65–99)
Potassium: 4.1 mmol/L (ref 3.5–5.1)
Sodium: 134 mmol/L — ABNORMAL LOW (ref 135–145)

## 2014-12-19 MED ORDER — TRAMADOL HCL 50 MG PO TABS
50.0000 mg | ORAL_TABLET | Freq: Once | ORAL | Status: AC
  Administered 2014-12-19: 50 mg via ORAL

## 2014-12-19 MED ORDER — TRAMADOL HCL 50 MG PO TABS: 50.0000 mg | ORAL_TABLET | Freq: Once | ORAL | Status: DC

## 2014-12-19 MED ORDER — TRAMADOL HCL 50 MG PO TABS: 50.0000 mg | ORAL_TABLET | Freq: Four times a day (QID) | ORAL | Status: DC | PRN

## 2014-12-19 MED ORDER — MORPHINE SULFATE (PF) 4 MG/ML IV SOLN
4.0000 mg | Freq: Once | INTRAVENOUS | Status: AC
  Administered 2014-12-19: 4 mg via INTRAVENOUS
  Filled 2014-12-19: qty 1

## 2014-12-19 MED ORDER — CEPHALEXIN 500 MG PO CAPS: 500.0000 mg | ORAL_CAPSULE | Freq: Four times a day (QID) | ORAL | Status: AC

## 2014-12-19 MED ORDER — KETOROLAC TROMETHAMINE 30 MG/ML IJ SOLN
30.0000 mg | Freq: Once | INTRAMUSCULAR | Status: AC
  Administered 2014-12-19: 30 mg via INTRAVENOUS
  Filled 2014-12-19: qty 1

## 2014-12-19 MED ORDER — TRAMADOL HCL 50 MG PO TABS
ORAL_TABLET | ORAL | Status: AC
  Administered 2014-12-19: 50 mg via ORAL
  Filled 2014-12-19: qty 1

## 2014-12-19 MED ORDER — ONDANSETRON HCL 4 MG/2ML IJ SOLN
4.0000 mg | Freq: Once | INTRAMUSCULAR | Status: AC
  Administered 2014-12-19: 4 mg via INTRAVENOUS
  Filled 2014-12-19: qty 2

## 2014-12-19 MED ORDER — SODIUM CHLORIDE 0.9 % IV BOLUS (SEPSIS)
1000.0000 mL | Freq: Once | INTRAVENOUS | Status: AC
  Administered 2014-12-19: 1000 mL via INTRAVENOUS

## 2014-12-19 NOTE — Discharge Instructions (Signed)
Dolor abdominal (Abdominal Pain) El dolor puede tener muchas causas. Normalmente la causa del dolor abdominal no es una enfermedad y Teacher, English as a foreign language sin Clinical research associate. Frecuentemente puede controlarse y tratarse en casa. Su mdico le Chartered certified accountant examen fsico y posiblemente solicite anlisis de sangre y radiografas para ayudar a Teacher, adult education la gravedad de su dolor. Sin embargo, en [employer], debe transcurrir ms tiempo antes de que se pueda Pension scheme manager una causa evidente del dolor. Antes de llegar a ese punto, es posible que su mdico no sepa si necesita ms pruebas o un tratamiento ms profundo. INSTRUCCIONES PARA EL CUIDADO EN EL HOGAR  Est atento al dolor para ver si hay cambios. Las siguientes indicaciones ayudarn a Chief Strategy Officer que pueda sentir:  [place] solo medicamentos de venta libre o recetados, segn las indicaciones del mdico.  No tome laxantes a menos que se lo haya indicado su mdico.  Pruebe con Ardelia Mems dieta lquida absoluta (caldo, t o agua) segn se lo indique su mdico. Introduzca gradualmente una dieta normal, segn su tolerancia. SOLICITE ATENCIN MDICA SI:  Tiene dolor abdominal sin explicacin.  Tiene dolor abdominal relacionado con nuseas o diarrea.  Tiene dolor cuando orina o defeca.  Experimenta dolor abdominal que lo despierta de noche.  Tiene dolor abdominal que empeora o mejora cuando come alimentos.  Tiene dolor abdominal que empeora cuando come alimentos grasosos.  Tiene fiebre. SOLICITE ATENCIN MDICA DE INMEDIATO SI:   El dolor no desaparece en un plazo mximo de 2horas.  No deja de (vomitar).  El Social research officer, government se siente solo en partes del abdomen, como el lado derecho o la parte inferior izquierda del abdomen.  Evaca materia fecal sanguinolenta o negra, de aspecto alquitranado. ASEGRESE DE QUE:  Comprende estas instrucciones.  Controlar su afeccin.  Recibir ayuda de inmediato si no mejora o si empeora. Document Released: 03/25/2005  Document Revised: 03/30/2013 [facility] Patient Information 2015 [place]. This information is not intended to replace advice given to you by your health care provider. Make sure you discuss any questions you have with your health care provider.  Disuria (Dysuria) Es el trmino que se aplica al trastorno de dolor al [employer]. Hay muchas causas de disuria, pero la ms frecuente es la infeccin del tracto urinario. Un anlisis de [place] puede confirmar si tiene una infeccin. Un cultivo de [place] 2 y 3 das. El cultivo de [place] confirma que usted o el nio estn enfermos. Deber concurrir a una visita de control debido a que:  Si le realizaron un cultivo, Civil Service fast streamer y las recomendaciones para Dispensing optician.  Si el cultivo de [place] fue positivo, deber tomar antibiticos o conocer si los antibiticos que le han prescripto son los correctos para su tipo de infeccin.  Si el cultivo es negativo (no hay infeccin del tracto urinario, debern buscar otras causas o habr que suspender los antibiticos. Puede ser que en el da de hoy le hayan hecho anlisis de laboratorio y no se haya hallado infeccin. Si se realizaron Chief of Staff 24 y 80 horas en [employer]. Puede ser que en el da de hoy le hayan tomado radiografas cuyo resultado es normal. No se ha hallado la causa del problema. Las radiografas sern reledas por un radilogo, que se comunicar con usted si encuentra algn resultado adicional. Puede ser que en el da de hoy a usted o a su nio le hayan indicado medicamentos para ayudarlo con su problema hasta que vea al mdico de cabecera. Si mejora, podr  consultar con su mdico de cabecera si reapareciera. Si se le han administrado antibiticos (medicamentos que [employer] grmenes), tmelos como se le han indicado Animator. Si se le han realizado anlisis de Designer, fashion/clothing, Careers information officer. Deje un  nmero telefnico para poder contactarlo. Si esto no es posible, averige cmo debe [employer]. INSTRUCCIONES PARA EL CUIDADO DOMICILIARIO  Beba gran cantidad de lquidos. Para adultos, beba 8 vasos de agua o jugo por [employer]. Para nios, reponga los lquidos como le indique su mdico.  Vacie la vejiga con frecuencia. Evite retener la orina durante largos perodos.  Despus de [employer], las mujeres deben limpiarse desde adelante hacia atrs, usando el papel higinico slo una vez.  Vace la vejiga antes y despus de Clinical biochemist.  Tome todos los medicamentos que le han recetado hasta que la infeccin haya desaparecido. Se sentir mejor en [employer], pero debe tomar TODOS LOS MEDICAMENTOS. Si se le ha administrado Pyridium, problemente la orina sea de un color oscuro. Esto puede hacer que su ropa interior se manche por lo que deber Risk manager una Psychologist, sport and exercise.  Evite la cafena, el t, el alcohol y las bebidas carbonatadas, debido a que tienden a Medical illustrator.  En los hombres, el alcohol puede irritar la prstata.  Utilice los medicamentos de venta libre o de prescripcin para Conservation officer, historic buildings, Health and safety inspector o la [place], segn se lo indique el profesional que lo asiste.  Si el profesional que lo [employer] pide que concurra a una cita de seguimiento, es importante asistir a ella. No concurrir a la Multimedia programmer como consecuencia una lesin crnica o [place], dolor, e incapacidad. Si tiene algn problema para asistir a la cita, debe comunicarse con el establecimiento para obtener asistencia. SOLICITE ATENCIN MDICA DE INMEDIATO SI:  Siente dolor en la espalda.  Sube la fiebre.  Si tiene nuseas (ganas de vomitar) o vmitos.  Si el problema no mejora con los medicamentos o [place]. EST SEGURO QUE:  Comprende las instrucciones para el alta mdica.  Controlar su enfermedad.  Solicitar atencin mdica de inmediato segn las  indicaciones. Document Released: 04/14/2007 Document Revised: 06/17/2011 [facility] Patient Information 2015 [place]. This information is not intended to replace advice given to you by your health care provider. Make sure you discuss any questions you have with your health care provider.  Hematuria (Hematuria) La hematuria es la presencia de sangre en la orina. La causa puede ser una infeccin en la vejiga, en los riones, en la prstata, clculos renales o cncer de las vas urinarias. Normalmente las infecciones pueden tratarse con medicamentos, y los clculos renales, por lo general, se eliminarn a travs de la orina. Si ninguna de estas es la causa de su hematuria, quizs se necesiten ms estudios para Education officer, community. Es muy importante que le informe a su mdico si ve sangre en la [place], aunque el sangrado se detenga sin tratamiento o no cause dolor. La sangre que aparece en la orina y luego se detiene y vuelve a aparecer nuevamente puede ser un sntoma de una enfermedad muy grave. Adems, el dolor no es un sntoma en las etapas iniciales de muchos tipos de cncer urinarios. INSTRUCCIONES PARA EL CUIDADO EN EL HOGAR   Beba mucho lquido, entre 3 a Software engineer. Si le han diagnosticado una infeccin, se recomienda especialmente el jugo de arndanos rojos, adems de grandes cantidades de [place].  Evite la cafena, el t y las bebidas con  gas porque suelen irritar la vejiga.  Evite el alcohol ya que puede Engineer, manufacturing.  Tome todos los [employer] se lo haya indicado el mdico.  Si le recetaron antibiticos, asegrese de terminarlos, incluso si comienza a sentirse mejor.  Si le han diagnosticado clculos renales, siga las instrucciones de su mdico con respecto a Astronomer la orina para Research scientist (medical) clculo.  Vace la vejiga con frecuencia. Evite retener la orina durante largos perodos.  Despus de defecar, las mujeres deben higienizarse desde adelante hacia atrs.  Use solo un papel por vez.  Si es [employer], vace la vejiga antes y despus de Clinical biochemist. SOLICITE ATENCIN MDICA SI:  Siente dolor en la espalda.  Tiene fiebre.  Tiene sensacin de Higher education careers adviser (nuseas) o vmitos.  Los sntomas no mejoran despus de 3 das. Si la condicin empeora, visite al mdico antes de lo previsto. SOLICITE ATENCIN MDICA DE INMEDIATO SI:   Vomita con frecuencia e intensamente y no puede tolerar los medicamentos.  Siente un dolor intenso en la espalda o el abdomen incluso tomando los medicamentos.  Elimina cogulos grandes o sangre con la [place].  Se siente extremadamente dbil, se desmaya o pierde el conocimiento. ASEGRESE DE QUE:   Comprende estas instrucciones.  Controlar su afeccin.  Recibir ayuda de inmediato si no mejora o si empeora. Document Released: 03/25/2005 Document Revised: 08/09/2013 [facility] Patient Information 2015 [place], [place]. This information is not intended to replace advice given to you by your health care provider. Make sure you discuss any questions you have with your health care provider.  Dolor en el flanco  (Flank Pain) El dolor en el flanco se refiere a aquel dolor que se localiza en un lado del cuerpo, entre la zona superior del abdomen y Counsellor. Puede aparecer en un perodo corto de tiempo (agudo) o puede durar mucho tiempo o ser recurrente (crnico). Puede ser leve o intenso. Puede tener diferentes causas.  CAUSAS  Algunas de las causas ms comunes son:   Esguince muscular.   Espasmos musculares.   Problemas en la columna vertebral (enfermedad de los discos).   Infeccin en el pulmn (neumona).   Lquido en los pulmones (edema pulmonar).   Infeccin renal.   Piedras en el rin.   Una erupcin cutnea muy dolorosa causada por el virus de la varicela (herpes zoster).  Enfermedad de la vescula biliar. INSTRUCCIONES PARA EL CUIDADO EN EL HOGAR  Los cuidados en el  hogar dependern de la causa del dolor. En general:   Haga reposo segn las indicaciones del mdico.  Debe ingerir gran cantidad de lquido para mantener la orina de tono claro o color amarillo plido.  Tome slo medicamentos de venta libre o recetados, segn las indicaciones del mdico. Algunos medicamentos ayudan a Best boy.  Informe al mdico si tiene algn Risk manager.  Concurra a las visitas de control, segn las indicaciones. SOLICITE ATENCIN MDICA DE INMEDIATO SI:   El dolor no se alivia con los [employer].   Tiene sntomas nuevos o que empeoran.  El dolor [place].   Siente dolor abdominal.   Le falta el aire.   Tiene nuseas o vmitos persistentes.   El abdomen se hincha.   Sufre mareos o se desmaya.   Observa sangre en la orina.  Tiene fiebre o sntomas persistentes durante ms de 2  3 das.  Tiene fiebre y los sntomas empeoran repentinamente. ASEGRESE DE QUE:   Comprende estas instrucciones.  Controlar su enfermedad.  Solicitar ayuda de inmediato si no mejora o si empeora. Document Released: 07/02/2007 Document Revised: 12/18/2011 [facility] Patient Information 2015 [place]. This information is not intended to replace advice given to you by your health care provider. Make sure you discuss any questions you have with your health care provider.

## 2014-12-19 NOTE — ED Notes (Signed)
States pain is not much better.  Iv fluids infusing.

## 2014-12-19 NOTE — ED Notes (Signed)
Pt states pain improved

## 2014-12-19 NOTE — ED Provider Notes (Signed)
[facility] Emergency Department Provider Note  ____________________________________________  Time seen: Approximately 0000 AM  I have reviewed the triage vital signs and the nursing notes.   HISTORY  Chief Complaint Dysuria    HPI Tina Blackburn is a 44 y.o. female who comes into the hospital with low-back pain radiating to her abdomen and genitals. The patient's spouse reports that tonight she went to urinate and it was bloody. The patient reports that she has had similar symptoms with back pain 6 months ago and was told that she had a kidney stone. She reports though that the pain is on both sides. The patient reports she with no vomiting. The pain is sharp and 8 out of 10 in intensity. The patient also reports that it hurts when she urinates. She is tried taking Tylenol for pain and her last menstrual period was 12/08/2014.   Past Medical History  Diagnosis Date  . Diabetes mellitus without complication     There are no active problems to display for this patient.   Past Surgical History  Procedure Laterality Date  . Cholecystectomy      Current Outpatient Rx  Name  Route  Sig  Dispense  Refill  . metFORMIN (GLUCOPHAGE) 1000 MG tablet   Oral   Take 1,000 mg by mouth 2 (two) times daily with a meal.         . omeprazole (PRILOSEC) 40 MG capsule   Oral   Take 40 mg by mouth daily.         . sitaGLIPtin (JANUVIA) 25 MG tablet   Oral   Take 25 mg by mouth daily.         . cephALEXin (KEFLEX) 500 MG capsule   Oral   Take 1 capsule (500 mg total) by mouth 4 (four) times daily.   20 capsule   0   . traMADol (ULTRAM) 50 MG tablet   Oral   Take 1 tablet (50 mg total) by mouth every 6 (six) hours as needed.   12 tablet   0     Allergies Review of patient's allergies indicates no known allergies.  History reviewed. No pertinent family history.  Social History Social History  Substance Use Topics  . Smoking status:  Never Smoker   . Smokeless tobacco: Never Used  . Alcohol Use: No    Review of Systems Constitutional: No fever/chills Eyes: No visual changes. ENT: No sore throat. Cardiovascular: Denies chest pain. Respiratory: Denies shortness of breath. Gastrointestinal: Abdominal pain and nausea with no vomiting.  No diarrhea.  No constipation. Genitourinary:  dysuria. Musculoskeletal: Flank pain Skin: Negative for rash. Neurological: Negative for headaches, focal weakness or numbness.  10-point ROS otherwise negative.  ____________________________________________   PHYSICAL EXAM:  VITAL SIGNS: ED Triage Vitals  Enc Vitals Group     BP 12/19/14 0126 106/69 mmHg     Pulse Rate 12/19/14 0126 84     Resp 12/19/14 0126 20     Temp --      Temp src --      SpO2 12/19/14 0126 99 %     Weight --      Height --      Head Cir --      Peak Flow --      Pain Score 12/18/14 2238 7     Pain Loc --      Pain Edu? --      Excl. in [place]? --     Constitutional:  Alert and oriented. Well appearing and in moderate distress. Eyes: Conjunctivae are normal. PERRL. EOMI. Head: Atraumatic. Nose: No congestion/rhinnorhea. Mouth/Throat: Mucous membranes are moist.  Oropharynx non-erythematous. Cardiovascular: Normal rate, regular rhythm. Grossly normal heart sounds.  Good peripheral circulation. Respiratory: Normal respiratory effort.  No retractions. Lungs CTAB. Gastrointestinal: Soft and nontender. No distention. Positive bowel sounds with right sided CVA tenderness to palpation Musculoskeletal: No lower extremity tenderness nor edema.   Neurologic:  Normal speech and language.  Skin:  Skin is warm, dry and intact.  Psychiatric: Mood and affect are normal.   ____________________________________________   LABS (all labs ordered are listed, but only abnormal results are displayed)  Labs Reviewed  URINALYSIS COMPLETEWITH MICROSCOPIC ([place]) - Abnormal; Notable for the following:    Color,  Urine YELLOW (*)    APPearance CLEAR (*)    Glucose, UA >500 (*)    Hgb urine dipstick 1+ (*)    Bacteria, UA FEW (*)    Squamous Epithelial / LPF 0-5 (*)    All other components within normal limits  BASIC METABOLIC PANEL - Abnormal; Notable for the following:    Sodium 134 (*)    Glucose, Bld 332 (*)    All other components within normal limits  CBC - Abnormal; Notable for the following:    Hemoglobin 9.9 (*)    HCT 32.1 (*)    MCV 65.4 (*)    MCH 20.2 (*)    MCHC 30.8 (*)    RDW 18.1 (*)    All other components within normal limits  POC URINE PREG, ED  POCT PREGNANCY, URINE   ____________________________________________  EKG  None ____________________________________________  RADIOLOGY  CT renal stone study: Unchanged punctate nonobstructing left nephrolithiasis, no acute abnormality in the abdomen or pelvis.  ____________________________________________   PROCEDURES  Procedure(s) performed: None  Critical Care performed: No  ____________________________________________   INITIAL IMPRESSION / ASSESSMENT AND PLAN / ED COURSE  Pertinent labs & imaging results that were available during my care of the patient were reviewed by me and considered in my medical decision making (see chart for details).  This is a 44 year old female who comes in with flank pain that started yesterday. The patient does have a history of kidney stones. The patient's urine does not show any significant amount of blood at this time but I will do a CT scan as the patient has had kidney stones in the past. I will give the patient a dose of morphine, Toradol and a liter of normal saline. I will reassess the patient once I received the results of her CT scan.  The patient reports that she still did have some pain but that it was improved from previous. The patient did not want any further pain medicine but was feeling hungry. At this time the patient's blood work is fairly unremarkable. I  discussed the results with the patient and will discharge her to home with some antibiotics given her hematuria as well as her dysuria and have her follow-up with her primary care physician. ____________________________________________   FINAL CLINICAL IMPRESSION(S) / ED DIAGNOSES  Final diagnoses:  Right flank pain  Pain with urination  Hematuria      Loney Hering, MD 12/19/14 0401

## 2015-03-19 ENCOUNTER — Encounter: Payer: Self-pay | Admitting: Emergency Medicine

## 2015-03-19 ENCOUNTER — Emergency Department
Admission: EM | Admit: 2015-03-19 | Discharge: 2015-03-20 | Disposition: A | Discharge status: Home / Self Care | Payer: Self-pay | Attending: Emergency Medicine | Admitting: Emergency Medicine

## 2015-03-19 DIAGNOSIS — H538 Other visual disturbances: Secondary | ICD-10-CM | POA: Insufficient documentation

## 2015-03-19 DIAGNOSIS — R000 Tachycardia, unspecified: Secondary | ICD-10-CM | POA: Insufficient documentation

## 2015-03-19 DIAGNOSIS — R1030 Lower abdominal pain, unspecified: Secondary | ICD-10-CM | POA: Insufficient documentation

## 2015-03-19 DIAGNOSIS — E119 Type 2 diabetes mellitus without complications: Secondary | ICD-10-CM | POA: Insufficient documentation

## 2015-03-19 DIAGNOSIS — R51 Headache: Secondary | ICD-10-CM | POA: Insufficient documentation

## 2015-03-19 DIAGNOSIS — Z7984 Long term (current) use of oral hypoglycemic drugs: Secondary | ICD-10-CM | POA: Insufficient documentation

## 2015-03-19 DIAGNOSIS — Z79899 Other long term (current) drug therapy: Secondary | ICD-10-CM | POA: Insufficient documentation

## 2015-03-19 DIAGNOSIS — R110 Nausea: Secondary | ICD-10-CM | POA: Insufficient documentation

## 2015-03-19 DIAGNOSIS — G252 Other specified forms of tremor: Secondary | ICD-10-CM

## 2015-03-19 DIAGNOSIS — G8929 Other chronic pain: Secondary | ICD-10-CM | POA: Insufficient documentation

## 2015-03-19 DIAGNOSIS — F419 Anxiety disorder, unspecified: Secondary | ICD-10-CM | POA: Insufficient documentation

## 2015-03-19 DIAGNOSIS — G4700 Insomnia, unspecified: Secondary | ICD-10-CM | POA: Insufficient documentation

## 2015-03-19 MED ORDER — LORAZEPAM 1 MG PO TABS
1.0000 mg | ORAL_TABLET | Freq: Once | ORAL | Status: AC
  Administered 2015-03-19: 1 mg via ORAL

## 2015-03-19 MED ORDER — LORAZEPAM 1 MG PO TABS
ORAL_TABLET | ORAL | Status: AC
  Filled 2015-03-19: qty 1

## 2015-03-19 NOTE — ED Notes (Signed)
Pt arrived via EMS with hands and arms shaking uncontrollably.  Interpreter present during questioning.  Per pt has hx of a "brain tumor" but her daughter has all the details, pt is unaware of prognosis.  She is complaint with metformin she says, glu in EMS was 200.  Pt states she got into an argument tonight with her husband and her shaking got much worse and id what prompted the call to EMS.

## 2015-03-20 ENCOUNTER — Emergency Department: Payer: Self-pay

## 2015-03-20 LAB — COMPREHENSIVE METABOLIC PANEL
ALBUMIN: 3.5 g/dL (ref 3.5–5.0)
ALK PHOS: 90 U/L (ref 38–126)
ALT: 25 U/L (ref 14–54)
AST: 25 U/L (ref 15–41)
Anion gap: 6 (ref 5–15)
BILIRUBIN TOTAL: 0.3 mg/dL (ref 0.3–1.2)
BUN: 12 mg/dL (ref 6–20)
CO2: 24 mmol/L (ref 22–32)
Calcium: 9.1 mg/dL (ref 8.9–10.3)
Chloride: 108 mmol/L (ref 101–111)
Creatinine, Ser: 0.44 mg/dL (ref 0.44–1.00)
GFR calc Af Amer: >60 mL/min (ref >60)
GFR calc non Af Amer: >60 mL/min (ref >60)
GLUCOSE: 189 mg/dL — AB (ref 65–99)
POTASSIUM: 3.7 mmol/L (ref 3.5–5.1)
Sodium: 138 mmol/L (ref 135–145)
TOTAL PROTEIN: 7.7 g/dL (ref 6.5–8.1)

## 2015-03-20 LAB — CBC
HEMATOCRIT: 30.8 % — AB (ref 35.0–47.0)
HEMOGLOBIN: 9.5 g/dL — AB (ref 12.0–16.0)
MCH: 19.9 pg — ABNORMAL LOW (ref 26.0–34.0)
MCHC: 30.9 g/dL — ABNORMAL LOW (ref 32.0–36.0)
MCV: 64.5 fL — ABNORMAL LOW (ref 80.0–100.0)
Platelets: 306 10*3/uL (ref 150–440)
RBC: 4.77 MIL/uL (ref 3.80–5.20)
RDW: 19.3 % — AB (ref 11.5–14.5)
WBC: 10.6 10*3/uL (ref 3.6–11.0)

## 2015-03-20 LAB — TSH: TSH: 2.604 u[IU]/mL (ref 0.350–4.500)

## 2015-03-20 LAB — TROPONIN I: Troponin I: <0.03 ng/mL (ref <0.031)

## 2015-03-20 LAB — MAGNESIUM: Magnesium: 1.6 mg/dL — ABNORMAL LOW (ref 1.7–2.4)

## 2015-03-20 MED ORDER — SODIUM CHLORIDE 0.9 % IV BOLUS (SEPSIS)
500.0000 mL | Freq: Once | INTRAVENOUS | Status: AC
  Administered 2015-03-20: 500 mL via INTRAVENOUS

## 2015-03-20 MED ORDER — METOCLOPRAMIDE HCL 5 MG/ML IJ SOLN
10.0000 mg | Freq: Once | INTRAMUSCULAR | Status: AC
  Administered 2015-03-20: 10 mg via INTRAVENOUS
  Filled 2015-03-20: qty 2

## 2015-03-20 MED ORDER — SODIUM CHLORIDE 0.9 % IV BOLUS (SEPSIS)
1000.0000 mL | Freq: Once | INTRAVENOUS | Status: AC
  Administered 2015-03-20: 1000 mL via INTRAVENOUS

## 2015-03-20 MED ORDER — BUTALBITAL-APAP-CAFFEINE 50-325-40 MG PO TABS: 1.0000 | ORAL_TABLET | Freq: Four times a day (QID) | ORAL | Status: DC | PRN

## 2015-03-20 MED ORDER — DIPHENHYDRAMINE HCL 50 MG/ML IJ SOLN
25.0000 mg | Freq: Once | INTRAMUSCULAR | Status: AC
  Administered 2015-03-20: 25 mg via INTRAVENOUS
  Filled 2015-03-20: qty 1

## 2015-03-20 MED ORDER — MAGNESIUM SULFATE 2 GM/50ML IV SOLN
2.0000 g | Freq: Once | INTRAVENOUS | Status: AC
  Administered 2015-03-20: 2 g via INTRAVENOUS
  Filled 2015-03-20: qty 50

## 2015-03-20 MED ORDER — KETOROLAC TROMETHAMINE 30 MG/ML IJ SOLN
30.0000 mg | Freq: Once | INTRAMUSCULAR | Status: AC
  Administered 2015-03-20: 30 mg via INTRAVENOUS
  Filled 2015-03-20: qty 1

## 2015-03-20 NOTE — ED Notes (Signed)
MD at bedside. 

## 2015-03-20 NOTE — ED Provider Notes (Signed)
[facility] Emergency Department Provider Note  ____________________________________________  Time seen: Approximately 2358 PM  I have reviewed the triage vital signs and the nursing notes.   HISTORY  Chief Complaint Tremors    HPI Tina Blackburn is a 44 y.o. female who comes into the hospital with multiple complaints. The patient reports that she's had tremors for the past 3 days. She reports that she has not been sleeping because she works nights and cannot sleep during the day. She reports that she's also had a headache for 3 days. The patient does have a history of insomnia as well as a history of migraines but reports that she's never had this tremors before. 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 Patient thinks that not sleeping is affecting her nervous system. The patient did have an argument with her husband tonight which wrought on the tremors. She reports that it been 6 months and they checked the tumor in her brain but she reports that her daughter has all of her medical paperwork. The patient does have some blurred vision but reports she thinks is due to her diabetes and she also has some mild lower abdominal pain but is currently on her period. The patient denies any chest pain shortness of breath back pain. She also reports that she does not have a history of anxiety. The patient has been nauseous and reports that she did pass out. The patient is here she is unable to calm down and she is not sure what is going on.   Past Medical History  Diagnosis Date  . Diabetes mellitus without complication ([place])     There are no active problems to display for this patient.   Past Surgical History  Procedure Laterality Date  . Cholecystectomy      Current Outpatient Rx  Name  Route  Sig  Dispense  Refill  . butalbital-acetaminophen-caffeine (FIORICET) 50-325-40 MG tablet    Oral   Take 1-2 tablets by mouth every 6 (six) hours as needed for headache.   20 tablet   0   . metFORMIN (GLUCOPHAGE) 1000 MG tablet   Oral   Take 1,000 mg by mouth 2 (two) times daily with a meal.         . omeprazole (PRILOSEC) 40 MG capsule   Oral   Take 40 mg by mouth daily.         . sitaGLIPtin (JANUVIA) 25 MG tablet   Oral   Take 25 mg by mouth daily.         . traMADol (ULTRAM) 50 MG tablet   Oral   Take 1 tablet (50 mg total) by mouth every 6 (six) hours as needed.   12 tablet   0     Allergies Review of patient's allergies indicates no known allergies.  History reviewed. No pertinent family history.  Social History Social History  Substance Use Topics  . Smoking status: Never Smoker   . Smokeless tobacco: Never Used  . Alcohol Use: No    Review of Systems Constitutional: No fever/chills Eyes: blurred vision. ENT: No sore throat. Cardiovascular: Denies chest pain. Respiratory: Denies shortness of breath. Gastrointestinal: No abdominal pain.  No nausea, no vomiting.  No diarrhea.  No constipation. Genitourinary: Negative for dysuria. Musculoskeletal: Negative for back pain. Skin: Negative for rash. Neurological: headache and shaking  10-point ROS otherwise negative.  ____________________________________________   PHYSICAL EXAM:  VITAL SIGNS: ED Triage Vitals  Enc Vitals Group     BP 03/19/15 2335 154/111 mmHg     Pulse Rate 03/20/15 0000 116     Resp 03/19/15 2335 22     Temp 03/19/15 2335 98.2 F (36.8 C)     Temp Source 03/19/15 2335 Oral     SpO2 03/19/15 2335 100 %     Weight --      Height --      Head Cir --      Peak Flow --      Pain Score 03/19/15 2336 7     Pain Loc --      Pain Edu? --      Excl. in [place]? --     Constitutional: Alert and oriented. anxious appearing, shaking and in moderate distress. Eyes: Conjunctivae are normal. PERRL. EOMI. Head: Atraumatic. Nose: No congestion/rhinnorhea. Mouth/Throat: Mucous  membranes are moist.  Oropharynx non-erythematous. Cardiovascular: tachycardia, regular rhythm. Grossly normal heart sounds.  Good peripheral circulation. Respiratory: Normal respiratory effort.  No retractions. Lungs CTAB. Gastrointestinal: Soft and nontender. No distention. Positive bowel sounds Musculoskeletal: No lower extremity tenderness nor edema.   Neurologic:  Normal speech and language. No gross focal neurologic deficits are appreciated.  Skin:  Skin is warm, dry and intact.  Psychiatric: Mood and affect are normal.   ____________________________________________   LABS (all labs ordered are listed, but only abnormal results are displayed)  Labs Reviewed  CBC - Abnormal; Notable for the following:    Hemoglobin 9.5 (*)    HCT 30.8 (*)    MCV 64.5 (*)    MCH 19.9 (*)    MCHC 30.9 (*)    RDW 19.3 (*)    All other components within normal limits  COMPREHENSIVE METABOLIC PANEL - Abnormal; Notable for the following:    Glucose, Bld 189 (*)    All other components within normal limits  MAGNESIUM - Abnormal; Notable for the following:    Magnesium 1.6 (*)    All other components within normal limits  TROPONIN I  TSH   ____________________________________________  EKG  ED ECG REPORT I, Loney Hering, the attending physician, personally viewed and interpreted this ECG.   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 ____________________________________________   PROCEDURES  Procedure(s) performed: None  Critical Care performed: No  ____________________________________________   INITIAL IMPRESSION / ASSESSMENT AND PLAN / ED COURSE  Pertinent labs & imaging results that were available during my care of  the patient were reviewed by me and considered in my medical decision making (see chart for details).  This is a 44 year old female who comes in shaking, with headache and not feeling well. I initially gave the patient a dose of Ativan 1 mg to see if that may have something to do with her symptoms. The patient also had low magnesium so I did give her a dose of magnesium sulfate. The patient received a dose of Reglan, Benadryl, Toradol and magnesium for her symptoms. When I returned to the patient's room she was sleeping and no longer had the tremors. When she awoke she also did not have the tremors. At this time we are completing the patient's normal saline and magnesium. As long as his symptoms remain resolved and she is  no longer tachycardic we'll have the patient discharged home.  Patient received another 500 mL bolus of normal saline and she continued to do well. The patient discharged home to follow-up with her primary care physician.  ____________________________________________   FINAL CLINICAL IMPRESSION(S) / ED DIAGNOSES  Final diagnoses:  Chronic nonintractable headache, unspecified headache type  Coarse tremors  Insomnia      Loney Hering, MD 03/20/15 [phone]

## 2015-03-20 NOTE — Discharge Instructions (Signed)
Dolor de cabeza general sin causa (General Headache Without Cause) El dolor de cabeza es un dolor o malestar que se siente en la zona de la cabeza o del cuello. Puede no tener una causa especfica. Hay muchas causas y tipos de dolores de [place]. Los dolores de cabeza ms comunes son los siguientes:  Cefalea tensional.  Cefaleas migraosas.  Cefalea en brotes.  Cefaleas diarias crnicas. INSTRUCCIONES PARA EL CUIDADO EN EL HOGAR  Controle su afeccin para ver si hay cambios. Siga estos pasos para Aeronautical engineer afeccin: Control del [employer] medicamentos de venta libre y los recetados solamente como se lo haya indicado el mdico.  Cuando sienta dolor de cabeza acustese en un cuarto oscuro y tranquilo.  Si se lo indican, aplique hielo sobre la cabeza y la zona del cuello:  Ponga el hielo en una bolsa plstica.  Coloque una toalla entre la piel y la bolsa de hielo.  Coloque el hielo durante 81minutos, 2 a 3veces por Training and development officer.  Utilice una almohadilla trmica o tome una ducha con agua caliente para aplicar calor en la cabeza y la zona del cuello como se lo haya indicado el [place] luces tenues si le [employer] luces brillantes o sus dolores de cabeza empeoran. Comida y bebida  Mantenga un horario para las comidas.  Limite el consumo de bebidas alcohlicas.  Consuma menos cantidad de cafena o deje de tomarla. Instrucciones generales  Concurra a todas las visitas de control como se lo haya indicado el mdico. Esto es importante.  Lleve un diario de los dolores de cabeza para Neurosurgeon qu factores pueden desencadenarlos. Por ejemplo, escriba los siguientes datos:  Lo que usted come y Buyer, retail.  Cunto tiempo duerme.  Algn cambio en su dieta o en los medicamentos.  Pruebe algunas tcnicas de relajacin, como los [place].  Limite el estrs.  Sintese con la espalda recta y no tense los msculos.  No consuma productos que contengan tabaco, incluidos  cigarrillos, tabaco de Higher education careers adviser o cigarrillos electrnicos. Si necesita ayuda para dejar de fumar, consulte al mdico.  Haga actividad fsica habitualmente como se lo haya indicado el mdico.  Tenga un horario fijo para dormir. Duerma entre 7 y 9horas o la cantidad de horas que le haya recomendado el mdico. SOLICITE ATENCIN MDICA SI:   Los medicamentos no Dealer los sntomas.  Tiene un dolor de cabeza que es diferente del dolor de cabeza habitual.  Tiene nuseas o vmitos.  Tiene fiebre. SOLICITE ATENCIN MDICA DE INMEDIATO SI:   El dolor se hace cada vez ms intenso.  Ha vomitado repetidas veces.  Presenta rigidez en el cuello.  Sufre prdida de la visin.  Tiene problemas para hablar.  Siente dolor en el ojo o en el odo.  Presenta debilidad muscular o prdida del control muscular.  Pierde el equilibrio o tiene problemas para Writer.  Sufre mareos o se desmaya.  Se siente confundido.   Esta informacin no tiene Marine scientist el consejo del mdico. Asegrese de hacerle al mdico cualquier pregunta que tenga.   Document Released: 01/02/2005 Document Revised: 12/14/2014 Elsevier Interactive Patient Education 2016 [place] (Insomnia) El insomnio es un trastorno del sueo que causa dificultades para conciliar el sueo o para [place]. Puede producir cansancio (fatiga), falta de energa, dificultad para concentrarse, cambios en el estado de nimo y mal rendimiento escolar o laboral.  Hay tres formas diferentes de clasificar el insomnio:   Dificultad para conciliar el sueo.  Dificultad para mantener el sueo.  Despertar muy precoz por la maana. Cualquier tipo de insomnio puede ser a Barrister's clerk (crnico) o a Control and instrumentation engineer (agudo). Ambos son frecuentes. Generalmente, el insomnio a corto plazo dura tres meses o menos tiempo. El crnico ocurre al menos tres veces por semana durante ms de tres meses. CAUSAS  El insomnio puede deberse a  otra afeccin, situacin o sustancia, por ejemplo:  Ansiedad.  Algunos medicamentos.  Enfermedad por reflujo gastroesofgico (ERGE) u otras enfermedades gastrointestinales.  Asma y otras enfermedades respiratorias.  Sndrome de las piernas inquietas, apnea del sueo u otros trastornos del sueo.  Dolor crnico.  Menopausia, que puede incluir calores repentinos.  Ictus.  Consumo excesivo de alcohol, tabaco u drogas ilegales.  Depresin.  Cafena.  Trastornos neurolgicos, como enfermedad de Alzheimer.  Hiperactividad tiroidea (hipertiroidismo). Es posible que la causa del insomnio no se conozca. FACTORES DE RIESGO Los factores de riesgo de tener insomnio incluyen lo siguiente:  El sexo. La mujeres se ven ms afectadas que los hombres.  La edad. El insomnio es ms frecuente a medida que una persona envejece.  El estrs. Esto puede incluir su vida profesional o personal.  Los ingresos. El insomnio es ms frecuente en las personas cuyos ingresos son ms bajos.  La falta de actividad fsica.  Los horarios de trabajo irregulares o los turnos nocturnos.  Los viajes a lugares de diferentes zonas horarias. SIGNOS Y SNTOMAS Si tiene insomnio, el sntoma principal es la dificultad para conciliar el sueo o mantenerlo. Esto puede derivar en otros sntomas, por ejemplo:  Sentirse fatigado.  Ponerse nervioso por [employer] irse a dormir.  No sentirse descansado por la maana.  Tener dificultad para concentrarse.  Sentirse irritable, ansioso o deprimido. TRATAMIENTO  El tratamiento para el insomnio depende de la causa. Si se debe a una enfermedad preexistente, el tratamiento se centrar en el abordaje de la enfermedad. El tratamiento tambin puede incluir lo siguiente:   Medicamentos que lo ayuden a dormir.  Asesoramiento psicolgico o terapia.  Cambios en el estilo de vida. [place] los medicamentos solamente como se lo haya  indicado el mdico.  Establezca horarios habituales para dormir y Clinical cytogeneticist. No tome siestas.  Lleve un registro del sueo ya que podra ser de utilidad para que usted y a su mdico puedan determinar qu podra estar causndole insomnio. Incluya lo siguiente:  Cundo duerme.  Cundo se despierta durante la noche.  Qu tan bien duerme.  Qu tan relajado se siente al da siguiente.  Cualquier efecto secundario de los medicamentos que toma.  Lo que usted come y bebe.  Convierta a su habitacin en un lugar cmodo donde sea fcil conciliar el sueo:  Coloque persianas o cortinas especiales oscuras que impidan la entrada de la luz del exterior.  Para bloquear los ruidos, use un aparato que reproduce sonidos ambientales o relajantes de fondo.  Mantenga baja la temperatura.  Haga ejercicio regularmente como se lo haya indicado el mdico. No haga ejercicio justo antes de la hora de [place].  Utilice tcnicas de relajacin para controlar el estrs. [place] al mdico que le sugiera algunas tcnicas que sean adecuadas para usted. Estos pueden incluir lo siguiente:  Ejercicios de respiracin.  Rutinas para aliviar la tensin muscular.  Visualizacin de escenas apacibles.  Disminucin del consumo de alcohol, bebidas con cafena y cigarrillos, especialmente cerca de la hora de [place], ya que pueden perturbarle el sueo.  No coma en exceso  ni consuma comidas picantes justo antes de la hora de [place]. Esto puede causarle molestias digestivas y dificultades para dormir.  Limite el uso de pantallas antes de la hora de [place]. Esto incluye lo siguiente:  Mirar televisin.  Usar el telfono inteligente, la tableta y la computadora.  Siga una rutina. Esto puede ayudarlo a conciliar el sueo ms rpidamente. Intente hacer una actividad tranquila, cepillarse los dientes e irse a la cama a la misma hora todas las noches.  Levntese de la cama si sigue despierto despus de  63minutos de haber intentado dormirse. [place] luces, pero intente leer o hacer una actividad tranquila. Cuando tenga sueo, regrese a Futures trader.  Conduzca con cuidado. No conduzca si est muy somnoliento.  Concurra a todas las visitas de control, segn le indique su mdico. Esto es importante. SOLICITE ATENCIN MDICA SI:   Est cansado durante el da o tiene dificultades en su rutina diaria debido a la somnolencia.  Sigue teniendo problemas para dormir o Press photographer. SOLICITE ATENCIN MDICA DE INMEDIATO SI:   Tiene pensamientos serios acerca de lastimarse a usted mismo o daar a Nurse, children's.   Esta informacin no tiene Marine scientist el consejo del mdico. Asegrese de hacerle al mdico cualquier pregunta que tenga.   Document Released: 03/25/2005 Document Revised: 04/15/2014 Elsevier Interactive Patient Education 2016 [employer].  [employer] (Tremor) Un temblor es un estremecimiento o una sacudida que no IT consultant. La [employer] de los temblores afectan las manos o los brazos. Tambin pueden afectar la cabeza, las cuerdas vocales, la cara y otras partes del cuerpo. Hay muchos tipos de temblores. Entre los ms frecuentes, se incluyen los siguientes:   Temblores hereditarios. Por lo general, aparecen en personas de ms de 40aos. Pueden transmitirse en la familia y afectar a personas sanas.  Temblores estticos. Se producen cuando los msculos estn en reposo, por ejemplo, al apoyar las manos sobre el regazo. Las personas con la enfermedad de Parkinson suelen tener temblores estticos.  Temblores posturales. Se producen al tratar de [employer], por ejemplo, sostener los brazos estirados.  Temblores cinticos. Aparecen durante el movimiento voluntario, por ejemplo, al tratar de llevar un dedo hacia la punta de la [place].  Temblores especficos de una tarea. Pueden producirse al realizar tareas como escribir, hablar o estar de pie.  Temblores  psicgenos. Estos desaparecen o se reducen considerablemente cuando la persona se distrae. Pueden presentarse en personas de todas las edades. Algunos tipos de temblores no tienen una causa conocida. Los temblores tambin pueden ser un sntoma de problemas en el sistema nervioso (trastornos neurolgicos) que aparecen con el envejecimiento. Algunos temblores desaparecen con un tratamiento, mientras que otros no.  INSTRUCCIONES PARA EL CUIDADO EN EL HOGAR Controle los temblores para ver si hay cambios. Las siguientes indicaciones ayudarn a Chief Strategy Officer que pueda sentir:  Tome los medicamentos solamente como se lo haya indicado el mdico.  Limite el consumo de alcohol a no ms de 1 medida por da si es mujer y no est Music therapist, y 2 medidas si es hombre. Una medida equivale a 12onzas de cerveza, 5onzas de vino o 1onzas de bebidas alcohlicas de alta graduacin.  No consuma ningn producto que contenga tabaco, lo que incluye cigarrillos, tabaco de Higher education careers adviser o Psychologist, sport and exercise. Si necesita ayuda para dejar de fumar, consulte al mdico.  Evite el calor o fro extremos.  Limite la cantidad de cafena que consume como se lo haya indicado el mdico.  Trate de dormir 8horas  todas las noches.  Encuentre la forma de controlar el estrs, por ejemplo, a travs de la meditacin o el yoga.  Concurra a todas las visitas de control como se lo haya indicado el mdico. Esto es importante. SOLICITE ATENCIN MDICA SI:  Empieza a temblar despus de comenzar un medicamento nuevo.  Tiene temblores con otros sntomas, como los siguientes:  Entumecimiento.  Hormigueo.  Dolor.  Debilidad.  Los temblores [employer].  Los temblores interfieren en su vida cotidiana.   Esta informacin no tiene Marine scientist el consejo del mdico. Asegrese de hacerle al mdico cualquier pregunta que tenga.   Document Released: 07/11/2008 Document Revised: 04/15/2014 Elsevier Interactive  Patient Education [employer].

## 2015-05-16 ENCOUNTER — Emergency Department: Payer: Self-pay

## 2015-05-16 ENCOUNTER — Encounter: Payer: Self-pay | Admitting: Emergency Medicine

## 2015-05-16 ENCOUNTER — Emergency Department
Admission: EM | Admit: 2015-05-16 | Discharge: 2015-05-16 | Disposition: A | Discharge status: Home / Self Care | Payer: Self-pay | Attending: Emergency Medicine | Admitting: Emergency Medicine

## 2015-05-16 DIAGNOSIS — Z79899 Other long term (current) drug therapy: Secondary | ICD-10-CM | POA: Insufficient documentation

## 2015-05-16 DIAGNOSIS — R197 Diarrhea, unspecified: Secondary | ICD-10-CM

## 2015-05-16 DIAGNOSIS — Z7984 Long term (current) use of oral hypoglycemic drugs: Secondary | ICD-10-CM | POA: Insufficient documentation

## 2015-05-16 DIAGNOSIS — E119 Type 2 diabetes mellitus without complications: Secondary | ICD-10-CM | POA: Insufficient documentation

## 2015-05-16 DIAGNOSIS — R112 Nausea with vomiting, unspecified: Secondary | ICD-10-CM

## 2015-05-16 DIAGNOSIS — Z3202 Encounter for pregnancy test, result negative: Secondary | ICD-10-CM | POA: Insufficient documentation

## 2015-05-16 DIAGNOSIS — K529 Noninfective gastroenteritis and colitis, unspecified: Secondary | ICD-10-CM | POA: Insufficient documentation

## 2015-05-16 LAB — POCT PREGNANCY, URINE: Preg Test, Ur: NEGATIVE

## 2015-05-16 LAB — COMPREHENSIVE METABOLIC PANEL
ALK PHOS: 87 U/L (ref 38–126)
ALT: 44 U/L (ref 14–54)
AST: 55 U/L — ABNORMAL HIGH (ref 15–41)
Albumin: 3.8 g/dL (ref 3.5–5.0)
Anion gap: 11 (ref 5–15)
BUN: 14 mg/dL (ref 6–20)
CO2: 22 mmol/L (ref 22–32)
CREATININE: 0.53 mg/dL (ref 0.44–1.00)
Calcium: 9.1 mg/dL (ref 8.9–10.3)
Chloride: 101 mmol/L (ref 101–111)
Glucose, Bld: 219 mg/dL — ABNORMAL HIGH (ref 65–99)
Potassium: 3.8 mmol/L (ref 3.5–5.1)
Sodium: 134 mmol/L — ABNORMAL LOW (ref 135–145)
Total Bilirubin: 0.6 mg/dL (ref 0.3–1.2)
Total Protein: 8.3 g/dL — ABNORMAL HIGH (ref 6.5–8.1)

## 2015-05-16 LAB — URINALYSIS COMPLETE WITH MICROSCOPIC (ARMC ONLY)
BILIRUBIN URINE: NEGATIVE
HGB URINE DIPSTICK: NEGATIVE
LEUKOCYTES UA: NEGATIVE
Nitrite: NEGATIVE
PH: 6 (ref 5.0–8.0)
Protein, ur: NEGATIVE mg/dL
SPECIFIC GRAVITY, URINE: 1.029 (ref 1.005–1.030)

## 2015-05-16 LAB — CBC WITH DIFFERENTIAL/PLATELET
Basophils Absolute: 0 10*3/uL (ref 0–0.1)
Basophils Relative: 0 %
Eosinophils Absolute: 0 10*3/uL (ref 0–0.7)
Eosinophils Relative: 0 %
HCT: 37.5 % (ref 35.0–47.0)
HEMOGLOBIN: 11.9 g/dL — AB (ref 12.0–16.0)
LYMPHS ABS: 0.8 10*3/uL — AB (ref 1.0–3.6)
LYMPHS PCT: 9 %
MCH: 21.8 pg — AB (ref 26.0–34.0)
MCHC: 31.8 g/dL — ABNORMAL LOW (ref 32.0–36.0)
MCV: 68.5 fL — AB (ref 80.0–100.0)
Monocytes Absolute: 0.5 10*3/uL (ref 0.2–0.9)
Monocytes Relative: 5 %
NEUTROS ABS: 7.7 10*3/uL — AB (ref 1.4–6.5)
NEUTROS PCT: 86 %
Platelets: 297 10*3/uL (ref 150–440)
RBC: 5.47 MIL/uL — AB (ref 3.80–5.20)
RDW: 23.1 % — ABNORMAL HIGH (ref 11.5–14.5)
WBC: 9.1 10*3/uL (ref 3.6–11.0)

## 2015-05-16 LAB — LIPASE, BLOOD: LIPASE: 22 U/L (ref 11–51)

## 2015-05-16 MED ORDER — TRAMADOL HCL 50 MG PO TABS: 50.0000 mg | ORAL_TABLET | Freq: Four times a day (QID) | ORAL | Status: DC | PRN

## 2015-05-16 MED ORDER — ONDANSETRON HCL 4 MG/2ML IJ SOLN
4.0000 mg | Freq: Once | INTRAMUSCULAR | Status: AC
Start: 2015-05-16 — End: 2015-05-16
  Administered 2015-05-16: 4 mg via INTRAVENOUS
  Filled 2015-05-16: qty 2

## 2015-05-16 MED ORDER — PROMETHAZINE HCL 25 MG PO TABS: 25.0000 mg | ORAL_TABLET | Freq: Four times a day (QID) | ORAL | Status: DC | PRN

## 2015-05-16 MED ORDER — SODIUM CHLORIDE 0.9 % IV BOLUS (SEPSIS)
1000.0000 mL | Freq: Once | INTRAVENOUS | Status: AC
  Administered 2015-05-16: 1000 mL via INTRAVENOUS

## 2015-05-16 MED ORDER — MORPHINE SULFATE (PF) 4 MG/ML IV SOLN
4.0000 mg | Freq: Once | INTRAVENOUS | Status: AC
  Administered 2015-05-16: 4 mg via INTRAVENOUS
  Filled 2015-05-16: qty 1

## 2015-05-16 NOTE — ED Provider Notes (Signed)
[facility] Emergency Department Provider Note  Time seen: 10:06 AM  I have reviewed the triage vital signs and the nursing notes.   HISTORY  Chief Complaint Abdominal Pain    HPI Tina Blackburn is a 45 y.o. female with a past medical history of diabetes who presents the emergency department with nausea, vomiting, diarrhea with diffuse abdominal cramping. According to the patient her daughter began with the same symptoms yesterday of nausea, vomiting, diarrhea and abdominal cramping. Beginning last night the patient also began with nausea, vomiting, diarrhea and abdominal cramping. It has worsened overnight and today the patient is unable to keep down fluids due to nausea and vomiting. States subjective fever/chills. Describes abdominal pain as mild to moderate, cramping sensation mostly in the epigastric and lower abdominal areas.   Past Medical History  Diagnosis Date  . Diabetes mellitus without complication ([place])     There are no active problems to display for this patient.   Past Surgical History  Procedure Laterality Date  . Cholecystectomy      Current Outpatient Rx  Name  Route  Sig  Dispense  Refill  . butalbital-acetaminophen-caffeine (FIORICET) 50-325-40 MG tablet   Oral   Take 1-2 tablets by mouth every 6 (six) hours as needed for headache.   20 tablet   0   . metFORMIN (GLUCOPHAGE) 1000 MG tablet   Oral   Take 1,000 mg by mouth 2 (two) times daily with a meal.         . omeprazole (PRILOSEC) 40 MG capsule   Oral   Take 40 mg by mouth daily.         . sitaGLIPtin (JANUVIA) 25 MG tablet   Oral   Take 25 mg by mouth daily.         . traMADol (ULTRAM) 50 MG tablet   Oral   Take 1 tablet (50 mg total) by mouth every 6 (six) hours as needed.   12 tablet   0     Allergies Review of patient's allergies indicates no known allergies.  History reviewed. No pertinent family history.  Social History Social History   Substance Use Topics  . Smoking status: Never Smoker   . Smokeless tobacco: Never Used  . Alcohol Use: No    Review of Systems Constitutional: Subjective fever/chills Cardiovascular: Negative for chest pain. Respiratory: Negative for shortness of breath. Gastrointestinal: Diffuse abdominal cramping. Positive nausea, vomiting, diarrhea Genitourinary: Negative for dysuria. Neurological: Negative for headache 10-point ROS otherwise negative.  ____________________________________________   PHYSICAL EXAM:  VITAL SIGNS: ED Triage Vitals  Enc Vitals Group     BP 05/16/15 0812 109/73 mmHg     Pulse Rate 05/16/15 0812 102     Resp 05/16/15 0812 20     Temp 05/16/15 0812 99.3 F (37.4 C)     Temp Source 05/16/15 0812 Oral     SpO2 05/16/15 0812 97 %     Weight --      Height 05/16/15 0812 5\' 3"  (1.6 m)     Head Cir --      Peak Flow --      Pain Score 05/16/15 0808 8     Pain Loc --      Pain Edu? --      Excl. in [place]? --     Constitutional: Alert and oriented. Well appearing and in no distress. Eyes: Normal exam ENT   Head: Normocephalic and atraumatic.   Mouth/Throat: Mucous membranes are moist.  Cardiovascular: Normal rate, regular rhythm. No murmur Respiratory: Normal respiratory effort without tachypnea nor retractions. Breath sounds are clear  Gastrointestinal: Soft, mild diffuse tenderness to palpation. No rebound or guarding. No distention. Musculoskeletal: Nontender with normal range of motion in all extremities.  Neurologic:  Normal speech and language. No gross focal neurologic deficits Skin:  Skin is warm, dry and intact.  Psychiatric: Mood and affect are normal. Speech and behavior are normal.  ____________________________________________   RADIOLOGY  CT shows no acute abnormality.  ____________________________________________    INITIAL IMPRESSION / ASSESSMENT AND PLAN / ED COURSE  Pertinent labs & imaging results that were available during  my care of the patient were reviewed by me and considered in my medical decision making (see chart for details).  Patient presents for nausea, vomiting, diarrhea, diffuse abdominal cramping approximately 24 hours after her daughters felt the same symptoms. Prior to her daughter developing symptoms she states children in the house also developed symptoms. Story sounds very suggestive of gastroenteritis possibly norovirus. We will check labs, IV hydrate, treat nausea and pain in the emergency department while awaiting lab results.   Labs have resulted showing a normal white blood cell count, electrolytes largely within normal limits. Trace ketones on urinalysis. We will IV hydrate, treat pain and nausea and monitor closely in the emergency department.  Labs largely within normal limits. Patient states she is feeling better but continues to have fairly significant left flank pain. We'll pursue the CT scan of left flank to rule out intra-abdominal pathology.   CT shows no acute abnormality we will discharge the patient home. Likely gastroenteritis. ____________________________________________   FINAL CLINICAL IMPRESSION(S) / ED DIAGNOSES  Gastroenteritis   Harvest Dark, MD 05/16/15 1359

## 2015-05-16 NOTE — ED Notes (Signed)
Vital signs stable. 

## 2015-05-16 NOTE — Discharge Instructions (Signed)
You have been seen in the emergency department for nausea, vomiting, diarrhea and abdominal pain. This is most likely due to a viral gastroenteritis. Please take your medications as prescribed, as needed. Please follow-up with a primary care physician in 2 days for recheck/reevaluation if you continue to have symptoms. Return to the emergency department if your symptoms worsen.   Nuseas y Vmitos (Nausea and Vomiting) La nusea es la sensacin de Tree surgeon en el estmago o de la necesidad de vomitar. El vmito es un reflejo por el que los contenidos del estmago salen por la boca. El vmito puede ocasionar prdida de lquidos del organismo (deshidratacin). Los nios y los [employer] pueden deshidratarse rpidamente (en especial si tambin tienen diarrea). Las nuseas y los vmitos son sntoma de un trastorno o enfermedad. Es importante Energy manager causa de los sntomas. CAUSAS  Irritacin directa de la membrana que cubre el [place]. Esta irritacin puede ser resultado del aumento de la produccin de cido, (reflujo gastroesofgico), infecciones, intoxicacin alimentaria, ciertos medicamentos (como antinflamatorios no esteroideos), consumo de alcohol o de tabaco.  Seales del cerebro.Estas seales pueden ser un dolor de cabeza, exposicin al calor, trastornos del odo interno, aumento de la presin en el cerebro por lesiones, infeccin, un tumor o conmocin cerebral, estmulos emocionales o problemas metablicos.  Una obstruccin en el tracto gastrointestinal (obstruccin intestinal).  Ciertas enfermedades como la diabetes, problemas en la vescula biliar, apendicitis, problemas renales, cncer, sepsis, sntomas atpicos de infarto o trastornos alimentarios.  Tratamientos mdicos como la quimioterapia y la radiacin.  Medicamentos que inducen al sueo (anestesia general) durante Clementeen Hoof. DIAGNSTICO  El mdico podr solicitarle algunos anlisis si los problemas no mejoran luego de  algunos das. Tambin podrn pedirle anlisis si los sntomas son graves o si el motivo de los vmitos o las nuseas no est claro. Los [employer] ser:   Anlisis de [place].  Anlisis de [place].  Pruebas de materia fecal.  Cultivos (para buscar evidencias de infeccin).  Radiografas u otros estudios por imgenes. Los [employer] de las pruebas lo ayudarn al mdico a tomar decisiones acerca del mejor curso de tratamiento o la necesidad de [employer].  TRATAMIENTO  Debe estar bien hidratado. Beba con frecuencia pequeas cantidades de lquido.Puede beber agua, bebidas deportivas, caldos claros o comer pequeos trocitos de hielo o gelatina para mantenerse hidratado.Cuando coma, hgalo lentamente para evitar las nuseas.Hay medicamentos para evitar las nuseas que pueden aliviarlo.  INSTRUCCIONES PARA EL CUIDADO DOMICILIARIO  Si su mdico le prescribe medicamentos tmelos como se le haya indicado.  Si no tiene hambre, no se fuerce a comer. Sin embargo, es necesario que tome lquidos.  Si tiene hambre alimntese con una dieta normal, a menos que el mdico le indique otra cosa.  Los mejores alimentos son Ardelia Mems combinacin de carbohidratos complejos (arroz, trigo, papas, pan), carnes magras, yogur, frutas y Photographer.  Evite los alimentos ricos en grasas porque dificultan la digestin.  Beba gran cantidad de lquido para mantener la orina de tono claro o color amarillo plido.  Si est deshidratado, consulte a su mdico para que le d instrucciones especficas para volver a hidratarlo. Los signos de deshidratacin son:  [place]istine [place] sed.  Labios y boca secos.  Mareos.  Elmon Else.  Disminucin de la frecuencia y cantidad de la [place].  Confusin.  Tiene el pulso o la respiracin acelerados. SOLICITE ATENCIN MDICA DE INMEDIATO SI:  Vomita sangre o algo similar a la borra del caf.  La materia fecal (heces) es negra o tiene  sangre.  Sufre una cefalea grave o rigidez  en el cuello.  Se siente confundido.  Siente dolor abdominal intenso.  Tiene dolor en el pecho o dificultad para respirar.  No orina por 8 horas.  Tiene la piel fra y pegajosa.  Sigue vomitando durante ms de 24 a 48 horas.  Tiene fiebre. ASEGRESE QUE:   Comprende estas instrucciones.  Controlar su enfermedad.  Solicitar ayuda inmediatamente si no mejora o si empeora.   Esta informacin no tiene Marine scientist el consejo del mdico. Asegrese de hacerle al mdico cualquier pregunta que tenga.   Document Released: 04/14/2007 Document Revised: 06/17/2011 Elsevier Interactive Patient Education [employer].

## 2015-05-16 NOTE — ED Notes (Signed)
Patient is resting comfortably. 

## 2015-05-16 NOTE — ED Notes (Signed)
Reports abd pain, n/v since last pm.  States her daughter was seen here for the same last pm.

## 2015-06-24 ENCOUNTER — Emergency Department: Payer: Self-pay

## 2015-06-24 ENCOUNTER — Emergency Department
Admission: EM | Admit: 2015-06-24 | Discharge: 2015-06-24 | Disposition: A | Discharge status: Home / Self Care | Payer: Self-pay | Attending: Emergency Medicine | Admitting: Emergency Medicine

## 2015-06-24 ENCOUNTER — Encounter: Payer: Self-pay | Admitting: *Deleted

## 2015-06-24 DIAGNOSIS — R05 Cough: Secondary | ICD-10-CM | POA: Insufficient documentation

## 2015-06-24 DIAGNOSIS — R51 Headache: Secondary | ICD-10-CM | POA: Insufficient documentation

## 2015-06-24 DIAGNOSIS — Z79899 Other long term (current) drug therapy: Secondary | ICD-10-CM | POA: Insufficient documentation

## 2015-06-24 DIAGNOSIS — J3489 Other specified disorders of nose and nasal sinuses: Secondary | ICD-10-CM | POA: Insufficient documentation

## 2015-06-24 DIAGNOSIS — E119 Type 2 diabetes mellitus without complications: Secondary | ICD-10-CM | POA: Insufficient documentation

## 2015-06-24 DIAGNOSIS — Z7984 Long term (current) use of oral hypoglycemic drugs: Secondary | ICD-10-CM | POA: Insufficient documentation

## 2015-06-24 DIAGNOSIS — Z3202 Encounter for pregnancy test, result negative: Secondary | ICD-10-CM | POA: Insufficient documentation

## 2015-06-24 DIAGNOSIS — R0789 Other chest pain: Secondary | ICD-10-CM | POA: Insufficient documentation

## 2015-06-24 DIAGNOSIS — R059 Cough, unspecified: Secondary | ICD-10-CM

## 2015-06-24 LAB — COMPREHENSIVE METABOLIC PANEL
ALT: 29 U/L (ref 14–54)
AST: 25 U/L (ref 15–41)
Albumin: 3.5 g/dL (ref 3.5–5.0)
Alkaline Phosphatase: 84 U/L (ref 38–126)
Anion gap: 7 (ref 5–15)
BUN: 12 mg/dL (ref 6–20)
CHLORIDE: 104 mmol/L (ref 101–111)
CO2: 22 mmol/L (ref 22–32)
CREATININE: 0.52 mg/dL (ref 0.44–1.00)
Calcium: 8.8 mg/dL — ABNORMAL LOW (ref 8.9–10.3)
GFR calc non Af Amer: >60 mL/min (ref >60)
Glucose, Bld: 168 mg/dL — ABNORMAL HIGH (ref 65–99)
POTASSIUM: 3.3 mmol/L — AB (ref 3.5–5.1)
SODIUM: 133 mmol/L — AB (ref 135–145)
Total Bilirubin: 0.3 mg/dL (ref 0.3–1.2)
Total Protein: 7.3 g/dL (ref 6.5–8.1)

## 2015-06-24 LAB — CBC WITH DIFFERENTIAL/PLATELET
Basophils Absolute: 0 10*3/uL (ref 0–0.1)
Basophils Relative: 0 %
EOS ABS: 0.2 10*3/uL (ref 0–0.7)
Eosinophils Relative: 2 %
HCT: 33.8 % — ABNORMAL LOW (ref 35.0–47.0)
HEMOGLOBIN: 10.9 g/dL — AB (ref 12.0–16.0)
LYMPHS ABS: 3.1 10*3/uL (ref 1.0–3.6)
LYMPHS PCT: 34 %
MCH: 23.5 pg — AB (ref 26.0–34.0)
MCHC: 32.4 g/dL (ref 32.0–36.0)
MCV: 72.5 fL — AB (ref 80.0–100.0)
MONOS PCT: 8 %
Monocytes Absolute: 0.7 10*3/uL (ref 0.2–0.9)
NEUTROS PCT: 56 %
Neutro Abs: 5 10*3/uL (ref 1.4–6.5)
Platelets: 265 10*3/uL (ref 150–440)
RBC: 4.66 MIL/uL (ref 3.80–5.20)
RDW: 20.1 % — ABNORMAL HIGH (ref 11.5–14.5)
WBC: 9 10*3/uL (ref 3.6–11.0)

## 2015-06-24 LAB — BRAIN NATRIURETIC PEPTIDE: B Natriuretic Peptide: 6 pg/mL (ref 0.0–100.0)

## 2015-06-24 LAB — URINALYSIS COMPLETE WITH MICROSCOPIC (ARMC ONLY)
BACTERIA UA: NONE SEEN
Bilirubin Urine: NEGATIVE
GLUCOSE, UA: NEGATIVE mg/dL
Ketones, ur: NEGATIVE mg/dL
Leukocytes, UA: NEGATIVE
Nitrite: NEGATIVE
PROTEIN: NEGATIVE mg/dL
Specific Gravity, Urine: 1.01 (ref 1.005–1.030)
pH: 6 (ref 5.0–8.0)

## 2015-06-24 LAB — FIBRIN DERIVATIVES D-DIMER (ARMC ONLY): FIBRIN DERIVATIVES D-DIMER (ARMC): 493 (ref 0–499)

## 2015-06-24 LAB — POCT PREGNANCY, URINE: PREG TEST UR: NEGATIVE

## 2015-06-24 LAB — LACTIC ACID, PLASMA
LACTIC ACID, VENOUS: 1.5 mmol/L (ref 0.5–2.0)
LACTIC ACID, VENOUS: 1.7 mmol/L (ref 0.5–2.0)

## 2015-06-24 LAB — TROPONIN I
Troponin I: <0.03 ng/mL (ref <0.031)
Troponin I: <0.03 ng/mL (ref <0.031)

## 2015-06-24 MED ORDER — MORPHINE SULFATE (PF) 4 MG/ML IV SOLN
4.0000 mg | Freq: Once | INTRAVENOUS | Status: AC
  Administered 2015-06-24: 4 mg via INTRAVENOUS
  Filled 2015-06-24: qty 1

## 2015-06-24 MED ORDER — IPRATROPIUM-ALBUTEROL 0.5-2.5 (3) MG/3ML IN SOLN
3.0000 mL | Freq: Once | RESPIRATORY_TRACT | Status: AC
  Administered 2015-06-24: 3 mL via RESPIRATORY_TRACT
  Filled 2015-06-24: qty 3

## 2015-06-24 MED ORDER — ONDANSETRON HCL 4 MG/2ML IJ SOLN
4.0000 mg | Freq: Once | INTRAMUSCULAR | Status: AC
  Administered 2015-06-24: 4 mg via INTRAVENOUS
  Filled 2015-06-24: qty 2

## 2015-06-24 MED ORDER — SODIUM CHLORIDE 0.9 % IV BOLUS (SEPSIS)
1000.0000 mL | Freq: Once | INTRAVENOUS | Status: AC
  Administered 2015-06-24: 1000 mL via INTRAVENOUS

## 2015-06-24 NOTE — ED Provider Notes (Signed)
[facility] Emergency Department Provider Note  ____________________________________________  Time seen: Approximately 2:19 AM  I have reviewed the triage vital signs and the nursing notes.   HISTORY  Chief Complaint Cough; Influenza; and Fever    HPI Riyaan Schwabe is a 45 y.o. female reports about 2 weeks ago she developed a cold and a cough she went to see her doctor was told she has the flu. She has no fever now which she has chest pain and pressure that is worse with coughing she coughs a lot and has small specks of blood come up from her nose and her mouth when she does cough. She had nose pain and a headache but that is now gone. She also feels short of breath and has some pain in her back. Pain is severe at times especially if she is coughing.   Past Medical History  Diagnosis Date  . Diabetes mellitus without complication ([place])     There are no active problems to display for this patient.   Past Surgical History  Procedure Laterality Date  . Cholecystectomy      Current Outpatient Rx  Name  Route  Sig  Dispense  Refill  . butalbital-acetaminophen-caffeine (FIORICET) 50-325-40 MG tablet   Oral   Take 1-2 tablets by mouth every 6 (six) hours as needed for headache.   20 tablet   0   . metFORMIN (GLUCOPHAGE) 1000 MG tablet   Oral   Take 1,000 mg by mouth 2 (two) times daily with a meal.         . omeprazole (PRILOSEC) 40 MG capsule   Oral   Take 40 mg by mouth daily.         . promethazine (PHENERGAN) 25 MG tablet   Oral   Take 1 tablet (25 mg total) by mouth every 6 (six) hours as needed for nausea or vomiting.   20 tablet   0   . sitaGLIPtin (JANUVIA) 25 MG tablet   Oral   Take 25 mg by mouth daily.         . traMADol (ULTRAM) 50 MG tablet   Oral   Take 1 tablet (50 mg total) by mouth every 6 (six) hours as needed.   20 tablet   0     Allergies Review of patient's allergies indicates no known  allergies.  History reviewed. No pertinent family history.  Social History Social History  Substance Use Topics  . Smoking status: Never Smoker   . Smokeless tobacco: Never Used  . Alcohol Use: No    Review of Systems Constitutional: No fever/chills Eyes: No visual changes. ENT: No sore throat. Cardiovascular: See history of present illness Respiratory: See history of present illness. Gastrointestinal: No abdominal pain.  No nausea, no vomiting.  No diarrhea.  No constipation. Genitourinary: Negative for dysuria. Musculoskeletal: Negative for back pain. Skin: Negative for rash. Neurological: Negative for headaches, focal weakness or numbness.  10-point ROS otherwise negative.  ____________________________________________   PHYSICAL EXAM:  VITAL SIGNS: ED Triage Vitals  Enc Vitals Group     BP 06/24/15 0127 145/75 mmHg     Pulse Rate 06/24/15 0127 87     Resp 06/24/15 0127 38     Temp 06/24/15 0127 98.2 F (36.8 C)     Temp Source 06/24/15 0127 Oral     SpO2 06/24/15 0127 98 %     Weight 06/24/15 0127 220 lb (99.791 kg)     Height 06/24/15 0127 5' (  1.524 m)     Head Cir --      Peak Flow --      Pain Score 06/24/15 0128 8     Pain Loc --      Pain Edu? --      Excl. in [place]? --     Constitutional: Alert and oriented. Appears anxious Eyes: Conjunctivae are normal. PERRL. EOMI. Head: Atraumatic. Nose: No congestion/rhinnorhea. Mouth/Throat: Mucous membranes are moist.  Oropharynx non-erythematous. Neck: No stridor.   Cardiovascular: Normal rate, regular rhythm. Grossly normal heart sounds.  Good peripheral circulation. Respiratory: Normal respiratory effort.  No retractions. Lungs CTAB. Gastrointestinal: Soft and nontender. No distention. No abdominal bruits. No CVA tenderness. Musculoskeletal: No lower extremity tenderness nor edema.  No joint effusions. Neurologic:  Normal speech and language. No gross focal neurologic deficits are appreciated. No gait  instability. Skin:  Skin is warm, dry and intact. No rash noted. Psychiatric: Mood and affect are normal. Speech and behavior are normal.  ____________________________________________   LABS (all labs ordered are listed, but only abnormal results are displayed)  Labs Reviewed  COMPREHENSIVE METABOLIC PANEL - Abnormal; Notable for the following:    Sodium 133 (*)    Potassium 3.3 (*)    Glucose, Bld 168 (*)    Calcium 8.8 (*)    All other components within normal limits  CBC WITH DIFFERENTIAL/PLATELET - Abnormal; Notable for the following:    Hemoglobin 10.9 (*)    HCT 33.8 (*)    MCV 72.5 (*)    MCH 23.5 (*)    RDW 20.1 (*)    All other components within normal limits  URINALYSIS COMPLETEWITH MICROSCOPIC ([facility] ONLY) - Abnormal; Notable for the following:    Color, Urine STRAW (*)    APPearance CLEAR (*)    Hgb urine dipstick 1+ (*)    Squamous Epithelial / LPF 0-5 (*)    All other components within normal limits  CULTURE, BLOOD (ROUTINE X 2)  CULTURE, BLOOD (ROUTINE X 2)  URINE CULTURE  LACTIC ACID, PLASMA  LACTIC ACID, PLASMA  TROPONIN I  BRAIN NATRIURETIC PEPTIDE  FIBRIN DERIVATIVES D-DIMER ([facility] ONLY)  TROPONIN I  POC URINE PREG, ED  POCT PREGNANCY, URINE   ____________________________________________  EKG  KG read and interpreted by me shows normal sinus rhythm rate of 86 normal axis flipped T waves in V2 and 3 otherwise no acute changes EKG similar to one from 2 years ago. ____________________________________________  RADIOLOGY  X-ray read by radiology as no acute disease ____________________________________________    ____________________________________________   INITIAL IMPRESSION / ASSESSMENT AND PLAN / ED COURSE  Pertinent labs & imaging results that were available during my care of the patient were reviewed by me and considered in my medical decision making (see chart for details).   ____________________________________________   FINAL  CLINICAL IMPRESSION(S) / ED DIAGNOSES  Final diagnoses:  Cough  Chest wall pain      Nena Polio, MD 06/27/15 1112

## 2015-06-24 NOTE — ED Notes (Signed)
Pt presents w/ c/o continued illness including flu-like sxs. Pt diagnosed w/ flu x 9 days ago. Pt was given rx but was unable to fill prescription secondary to inability to produce valid ID. Pt is tachypneic in triage, pale, appears unwell. Pt states she is coughes very hard and has seen blood in her sputum. Pt states vomiting x 6 since yesterday morning. Pt is able to drink water w/o vomiting. Pt c/o chest pain related to cough, reproducible w/ cough and inspiration.

## 2015-06-24 NOTE — ED Notes (Signed)
Patient transported to X-ray 

## 2015-06-24 NOTE — ED Notes (Signed)
MD at bedside. 

## 2015-06-26 LAB — URINE CULTURE

## 2015-06-29 LAB — CULTURE, BLOOD (ROUTINE X 2)
Culture: NO GROWTH
Culture: NO GROWTH

## 2015-08-08 ENCOUNTER — Emergency Department
Admission: EM | Admit: 2015-08-08 | Discharge: 2015-08-08 | Disposition: A | Discharge status: Home / Self Care | Payer: Self-pay | Attending: Emergency Medicine | Admitting: Emergency Medicine

## 2015-08-08 ENCOUNTER — Encounter: Payer: Self-pay | Admitting: *Deleted

## 2015-08-08 ENCOUNTER — Emergency Department: Payer: Self-pay

## 2015-08-08 DIAGNOSIS — R110 Nausea: Secondary | ICD-10-CM | POA: Insufficient documentation

## 2015-08-08 DIAGNOSIS — R1032 Left lower quadrant pain: Secondary | ICD-10-CM | POA: Insufficient documentation

## 2015-08-08 DIAGNOSIS — Z7984 Long term (current) use of oral hypoglycemic drugs: Secondary | ICD-10-CM | POA: Insufficient documentation

## 2015-08-08 DIAGNOSIS — Z79899 Other long term (current) drug therapy: Secondary | ICD-10-CM | POA: Insufficient documentation

## 2015-08-08 DIAGNOSIS — R350 Frequency of micturition: Secondary | ICD-10-CM | POA: Insufficient documentation

## 2015-08-08 DIAGNOSIS — R109 Unspecified abdominal pain: Secondary | ICD-10-CM

## 2015-08-08 DIAGNOSIS — E119 Type 2 diabetes mellitus without complications: Secondary | ICD-10-CM | POA: Insufficient documentation

## 2015-08-08 LAB — URINALYSIS COMPLETE WITH MICROSCOPIC (ARMC ONLY)
Bilirubin Urine: NEGATIVE
Hgb urine dipstick: NEGATIVE
Leukocytes, UA: NEGATIVE
Nitrite: NEGATIVE
Protein, ur: NEGATIVE mg/dL
Specific Gravity, Urine: 1.025 (ref 1.005–1.030)
WBC, UA: NONE SEEN WBC/hpf (ref 0–5)
pH: 6 (ref 5.0–8.0)

## 2015-08-08 LAB — BASIC METABOLIC PANEL
Anion gap: 8 (ref 5–15)
BUN: 14 mg/dL (ref 6–20)
CHLORIDE: 106 mmol/L (ref 101–111)
CO2: 22 mmol/L (ref 22–32)
Calcium: 8.8 mg/dL — ABNORMAL LOW (ref 8.9–10.3)
Creatinine, Ser: 0.52 mg/dL (ref 0.44–1.00)
GFR calc Af Amer: >60 mL/min (ref >60)
GLUCOSE: 243 mg/dL — AB (ref 65–99)
POTASSIUM: 3.9 mmol/L (ref 3.5–5.1)
Sodium: 136 mmol/L (ref 135–145)

## 2015-08-08 LAB — CBC
HEMATOCRIT: 32.5 % — AB (ref 35.0–47.0)
Hemoglobin: 10.6 g/dL — ABNORMAL LOW (ref 12.0–16.0)
MCH: 23.5 pg — AB (ref 26.0–34.0)
MCHC: 32.7 g/dL (ref 32.0–36.0)
MCV: 71.7 fL — AB (ref 80.0–100.0)
Platelets: 244 10*3/uL (ref 150–440)
RBC: 4.53 MIL/uL (ref 3.80–5.20)
RDW: 16.2 % — AB (ref 11.5–14.5)
WBC: 9.1 10*3/uL (ref 3.6–11.0)

## 2015-08-08 LAB — POCT PREGNANCY, URINE: PREG TEST UR: NEGATIVE

## 2015-08-08 MED ORDER — HYDROCODONE-ACETAMINOPHEN 5-325 MG PO TABS: 1.0000 | ORAL_TABLET | Freq: Four times a day (QID) | ORAL | Status: DC | PRN

## 2015-08-08 MED ORDER — ONDANSETRON HCL 4 MG/2ML IJ SOLN
4.0000 mg | Freq: Once | INTRAMUSCULAR | Status: AC
  Administered 2015-08-08: 4 mg via INTRAVENOUS
  Filled 2015-08-08: qty 2

## 2015-08-08 MED ORDER — KETOROLAC TROMETHAMINE 30 MG/ML IJ SOLN
30.0000 mg | Freq: Once | INTRAMUSCULAR | Status: AC
  Administered 2015-08-08: 30 mg via INTRAVENOUS
  Filled 2015-08-08: qty 1

## 2015-08-08 MED ORDER — SODIUM CHLORIDE 0.9 % IV BOLUS (SEPSIS)
1000.0000 mL | Freq: Once | INTRAVENOUS | Status: AC
  Administered 2015-08-08: 1000 mL via INTRAVENOUS

## 2015-08-08 MED ORDER — IBUPROFEN 800 MG PO TABS: 800.0000 mg | ORAL_TABLET | Freq: Three times a day (TID) | ORAL | Status: DC | PRN

## 2015-08-08 MED ORDER — MORPHINE SULFATE (PF) 4 MG/ML IV SOLN
4.0000 mg | Freq: Once | INTRAVENOUS | Status: AC
  Administered 2015-08-08: 4 mg via INTRAVENOUS
  Filled 2015-08-08: qty 1

## 2015-08-08 NOTE — ED Notes (Signed)
Pt returned from CT °

## 2015-08-08 NOTE — ED Notes (Addendum)
Pt has lower back pain with urinary frequency.  No n/v/d.  Pt also reports left side abd pain.  Hx of kidney stones.  Pt alert.

## 2015-08-08 NOTE — ED Provider Notes (Signed)
[facility] Emergency Department Provider Note   ____________________________________________  Time seen: Approximately 4:03 AM  I have reviewed the triage vital signs and the nursing notes.   HISTORY  Chief Complaint Back Pain and Urinary Frequency  History obtained via Spanish interpreter  HPI Tina Blackburn is a 45 y.o. female who presents to the ED from home with a chief complaint of left flank to left lower quadrant pain. No onset of left flank pain approximately 8 PM radiating to her left lower quadrant associated with nausea and difficulty urinating. Patient has a history of kidney stones and states this feels similarly. Denies associated symptoms of fever, chills, chest pain, shortness of breath, diarrhea. Denies vaginal bleeding or discharge. Denies recent travel or trauma. Nothing makes her symptoms better or worse.   Past Medical History  Diagnosis Date  . Diabetes mellitus without complication ([place])     There are no active problems to display for this patient.   Past Surgical History  Procedure Laterality Date  . Cholecystectomy      Current Outpatient Rx  Name  Route  Sig  Dispense  Refill  . butalbital-acetaminophen-caffeine (FIORICET) 50-325-40 MG tablet   Oral   Take 1-2 tablets by mouth every 6 (six) hours as needed for headache.   20 tablet   0   . metFORMIN (GLUCOPHAGE) 1000 MG tablet   Oral   Take 1,000 mg by mouth 2 (two) times daily with a meal.         . omeprazole (PRILOSEC) 40 MG capsule   Oral   Take 40 mg by mouth daily.         . promethazine (PHENERGAN) 25 MG tablet   Oral   Take 1 tablet (25 mg total) by mouth every 6 (six) hours as needed for nausea or vomiting.   20 tablet   0   . sitaGLIPtin (JANUVIA) 25 MG tablet   Oral   Take 25 mg by mouth daily.         . traMADol (ULTRAM) 50 MG tablet   Oral   Take 1 tablet (50 mg total) by mouth every 6 (six) hours as needed.   20 tablet    0     Allergies Review of patient's allergies indicates no known allergies.  No family history on file.  Social History Social History  Substance Use Topics  . Smoking status: Never Smoker   . Smokeless tobacco: Never Used  . Alcohol Use: No    Review of Systems  Constitutional: No fever/chills. Eyes: No visual changes. ENT: No sore throat. Cardiovascular: Denies chest pain. Respiratory: Denies shortness of breath. Gastrointestinal: Positive for left flank and left lower quadrant abdominal pain.  Positive for nausea, no vomiting.  No diarrhea.  No constipation. Genitourinary: Positive for urinary frequency. Negative for dysuria. Musculoskeletal: Negative for back pain. Skin: Negative for rash. Neurological: Negative for headaches, focal weakness or numbness.  10-point ROS otherwise negative.  ____________________________________________   PHYSICAL EXAM:  VITAL SIGNS: ED Triage Vitals  Enc Vitals Group     BP 08/08/15 0054 139/82 mmHg     Pulse Rate 08/08/15 0054 87     Resp 08/08/15 0054 22     Temp 08/08/15 0054 98.4 F (36.9 C)     Temp Source 08/08/15 0054 Oral     SpO2 08/08/15 0054 99 %     Weight 08/08/15 0054 220 lb (99.791 kg)     Height 08/08/15 0054 4\' 10"  (  1.473 m)     Head Cir --      Peak Flow --      Pain Score 08/08/15 0054 8     Pain Loc --      Pain Edu? --      Excl. in [place]? --     Constitutional: Alert and oriented. Well appearing and in moderate acute distress. Tearful. Eyes: Conjunctivae are normal. PERRL. EOMI. Head: Atraumatic. Nose: No congestion/rhinnorhea. Mouth/Throat: Mucous membranes are moist.  Oropharynx non-erythematous. Neck: No stridor.   Cardiovascular: Normal rate, regular rhythm. Grossly normal heart sounds.  Good peripheral circulation. Respiratory: Normal respiratory effort.  No retractions. Lungs CTAB. Gastrointestinal: Obese. Soft and mildly tender to palpation left lower quadrant and left lateral abdomen without  rebound or guarding.. No distention. No abdominal bruits. Mild left CVA tenderness. Musculoskeletal: No lower extremity tenderness nor edema.  No joint effusions. Neurologic:  Normal speech and language. No gross focal neurologic deficits are appreciated. No gait instability. Skin:  Skin is warm, dry and intact. No rash noted. Psychiatric: Mood and affect are normal. Speech and behavior are normal.  ____________________________________________   LABS (all labs ordered are listed, but only abnormal results are displayed)  Labs Reviewed  BASIC METABOLIC PANEL - Abnormal; Notable for the following:    Glucose, Bld 243 (*)    Calcium 8.8 (*)    All other components within normal limits  CBC - Abnormal; Notable for the following:    Hemoglobin 10.6 (*)    HCT 32.5 (*)    MCV 71.7 (*)    MCH 23.5 (*)    RDW 16.2 (*)    All other components within normal limits  URINALYSIS COMPLETEWITH MICROSCOPIC ([facility] ONLY) - Abnormal; Notable for the following:    Color, Urine YELLOW (*)    APPearance CLEAR (*)    Glucose, UA >500 (*)    Ketones, ur TRACE (*)    Bacteria, UA RARE (*)    Squamous Epithelial / LPF 0-5 (*)    All other components within normal limits  POC URINE PREG, ED  POCT PREGNANCY, URINE   ____________________________________________  EKG  None ____________________________________________  RADIOLOGY  CT renal stone study interpreted per Dr. Jeannine Boga: 1. Punctate nonobstructive left renal nephrolithiasis. No CT evidence for obstructive uropathy. 2. No other acute intra-abdominal or pelvic process identified. ____________________________________________   PROCEDURES  Procedure(s) performed: None  Critical Care performed: No  ____________________________________________   INITIAL IMPRESSION / ASSESSMENT AND PLAN / ED COURSE  Pertinent labs & imaging results that were available during my care of the patient were reviewed by me and considered in my medical  decision making (see chart for details).  45 year old female with a history of kidney stones who presents with left flank to left lower quadrant abdominal pain. Laboratory results unremarkable. Urinalysis remarkable for 0-5 RBC. Will initiate IV fluid resuscitation, IV analgesia and obtain CT renal colic study.  ----------------------------------------- 6:07 AM on 08/08/2015 -----------------------------------------  Pain improved, starting to return. Discussed with patient and spouse results of CT scan; cannot rule out tiny stone or non-calcium stone which does not show up on CT. Plan for prescriptions for analgesia and patient to follow-up closely with her PCP this week. Strict return precautions given. Both verbalize understanding and agree with plan of care. ____________________________________________   FINAL CLINICAL IMPRESSION(S) / ED DIAGNOSES  Final diagnoses:  Flank pain  Left lower quadrant pain      NEW MEDICATIONS STARTED DURING THIS VISIT:  New Prescriptions  No medications on file     Note:  This document was prepared using Dragon voice recognition software and may include unintentional dictation errors.    Paulette Blanch, MD 08/08/15 0730

## 2015-08-08 NOTE — Discharge Instructions (Signed)
1. You may take pain medicines as needed (Motrin/Norco #15). 2. Drink plenty of fluids daily. 3. Return to the ER for worsening symptoms, persistent vomiting, fever, difficulty breathing or other concerns.  Dolor en el flanco  (Flank Pain) El dolor en el flanco se refiere a aquel dolor que se localiza en un lado del cuerpo, entre la zona superior del abdomen y Counsellor. Puede aparecer en un perodo corto de tiempo (agudo) o puede durar mucho tiempo o ser recurrente (crnico). Puede ser leve o intenso. Puede tener diferentes causas.  CAUSAS  Algunas de las causas ms comunes son:   Esguince muscular.   Espasmos musculares.   Problemas en la columna vertebral (enfermedad de los discos).   Infeccin en el pulmn (neumona).   Lquido en los pulmones (edema pulmonar).   Infeccin renal.   Piedras en el rin.   Una erupcin cutnea muy dolorosa causada por el virus de la varicela (herpes zoster).  Enfermedad de la vescula biliar. INSTRUCCIONES PARA EL CUIDADO EN EL HOGAR  Los cuidados en el hogar dependern de la causa del dolor. En general:   Haga reposo segn las indicaciones del mdico.  Debe ingerir gran cantidad de lquido para mantener la orina de tono claro o color amarillo plido.  Tome slo medicamentos de venta libre o recetados, segn las indicaciones del mdico. Algunos medicamentos ayudan a Best boy.  Informe al mdico si tiene algn Risk manager.  Concurra a las visitas de control, segn las indicaciones. SOLICITE ATENCIN MDICA DE INMEDIATO SI:   El dolor no se alivia con los [employer].   Tiene sntomas nuevos o que empeoran.  El dolor [place].   Siente dolor abdominal.   Le falta el aire.   Tiene nuseas o vmitos persistentes.   El abdomen se hincha.   Sufre mareos o se desmaya.   Observa sangre en la orina.  Tiene fiebre o sntomas persistentes durante ms de 2  3 das.  Tiene fiebre y los sntomas empeoran  repentinamente. ASEGRESE DE QUE:   Comprende estas instrucciones.  Controlar su enfermedad.  Solicitar ayuda de inmediato si no mejora o si empeora.   Esta informacin no tiene Marine scientist el consejo del mdico. Asegrese de hacerle al mdico cualquier pregunta que tenga.   Document Released: 07/02/2007 Document Revised: 12/18/2011 Elsevier Interactive Patient Education 2016 [place] abdominal en adultos (Abdominal Pain, Adult) El dolor puede tener muchas causas. Normalmente la causa del dolor abdominal no es una enfermedad y Teacher, English as a foreign language sin Clinical research associate. Frecuentemente puede controlarse y tratarse en casa. Su mdico le Chartered certified accountant examen fsico y posiblemente solicite anlisis de sangre y radiografas para ayudar a Teacher, adult education la gravedad de su dolor. Sin embargo, en [employer], debe transcurrir ms tiempo antes de que se pueda Pension scheme manager una causa evidente del dolor. Antes de llegar a ese punto, es posible que su mdico no sepa si necesita ms pruebas o un tratamiento ms profundo. INSTRUCCIONES PARA EL CUIDADO EN EL HOGAR  Est atento al dolor para ver si hay cambios. Las siguientes indicaciones ayudarn a Chief Strategy Officer que pueda sentir:  [place] solo medicamentos de venta libre o recetados, segn las indicaciones del mdico.  No tome laxantes a menos que se lo haya indicado su mdico.  Pruebe con Ardelia Mems dieta lquida absoluta (caldo, t o agua) segn se lo indique su mdico. Introduzca gradualmente una dieta normal, segn su tolerancia. SOLICITE ATENCIN MDICA SI:  Tiene dolor abdominal sin explicacin.  Tiene dolor abdominal relacionado con nuseas o diarrea.  Tiene dolor cuando orina o defeca.  Experimenta dolor abdominal que lo despierta de noche.  Tiene dolor abdominal que empeora o mejora cuando come alimentos.  Tiene dolor abdominal que empeora cuando come alimentos grasosos.  Tiene fiebre. SOLICITE ATENCIN MDICA DE INMEDIATO SI:   El  dolor no desaparece en un plazo mximo de 2horas.  No deja de (vomitar).  El Social research officer, government se siente solo en partes del abdomen, como el lado derecho o la parte inferior izquierda del abdomen.  Evaca materia fecal sanguinolenta o negra, de aspecto alquitranado. ASEGRESE DE QUE:  Comprende estas instrucciones.  Controlar su afeccin.  Recibir ayuda de inmediato si no mejora o si empeora.   Esta informacin no tiene Marine scientist el consejo del mdico. Asegrese de hacerle al mdico cualquier pregunta que tenga.   Document Released: 03/25/2005 Document Revised: 04/15/2014 Elsevier Interactive Patient Education [employer].

## 2015-08-08 NOTE — ED Notes (Signed)
POCT Preg Negative

## 2015-08-09 ENCOUNTER — Encounter: Payer: Self-pay | Admitting: Emergency Medicine

## 2015-08-09 ENCOUNTER — Emergency Department
Admission: EM | Admit: 2015-08-09 | Discharge: 2015-08-09 | Disposition: A | Discharge status: Home / Self Care | Payer: Self-pay | Attending: Emergency Medicine | Admitting: Emergency Medicine

## 2015-08-09 ENCOUNTER — Emergency Department: Payer: Self-pay

## 2015-08-09 DIAGNOSIS — R1032 Left lower quadrant pain: Secondary | ICD-10-CM | POA: Insufficient documentation

## 2015-08-09 DIAGNOSIS — Z79899 Other long term (current) drug therapy: Secondary | ICD-10-CM | POA: Insufficient documentation

## 2015-08-09 DIAGNOSIS — Z7984 Long term (current) use of oral hypoglycemic drugs: Secondary | ICD-10-CM | POA: Insufficient documentation

## 2015-08-09 DIAGNOSIS — E119 Type 2 diabetes mellitus without complications: Secondary | ICD-10-CM | POA: Insufficient documentation

## 2015-08-09 LAB — COMPREHENSIVE METABOLIC PANEL
ALK PHOS: 86 U/L (ref 38–126)
ALT: 35 U/L (ref 14–54)
ANION GAP: 9 (ref 5–15)
AST: 28 U/L (ref 15–41)
Albumin: 4 g/dL (ref 3.5–5.0)
BUN: 13 mg/dL (ref 6–20)
CALCIUM: 9.1 mg/dL (ref 8.9–10.3)
CO2: 23 mmol/L (ref 22–32)
Chloride: 101 mmol/L (ref 101–111)
Creatinine, Ser: 0.5 mg/dL (ref 0.44–1.00)
GFR calc non Af Amer: >60 mL/min (ref >60)
Glucose, Bld: 167 mg/dL — ABNORMAL HIGH (ref 65–99)
Potassium: 3.7 mmol/L (ref 3.5–5.1)
SODIUM: 133 mmol/L — AB (ref 135–145)
TOTAL PROTEIN: 8.1 g/dL (ref 6.5–8.1)
Total Bilirubin: <0.1 mg/dL — ABNORMAL LOW (ref 0.3–1.2)

## 2015-08-09 LAB — CBC
HCT: 36.1 % (ref 35.0–47.0)
HEMOGLOBIN: 11.4 g/dL — AB (ref 12.0–16.0)
MCH: 23.4 pg — AB (ref 26.0–34.0)
MCHC: 31.6 g/dL — AB (ref 32.0–36.0)
MCV: 74.2 fL — ABNORMAL LOW (ref 80.0–100.0)
Platelets: 249 10*3/uL (ref 150–440)
RBC: 4.86 MIL/uL (ref 3.80–5.20)
RDW: 16.5 % — AB (ref 11.5–14.5)
WBC: 8.3 10*3/uL (ref 3.6–11.0)

## 2015-08-09 LAB — LIPASE, BLOOD: Lipase: 26 U/L (ref 11–51)

## 2015-08-09 MED ORDER — ONDANSETRON HCL 4 MG/2ML IJ SOLN
4.0000 mg | Freq: Once | INTRAMUSCULAR | Status: AC
  Administered 2015-08-09: 4 mg via INTRAVENOUS
  Filled 2015-08-09: qty 2

## 2015-08-09 MED ORDER — SODIUM CHLORIDE 0.9 % IV BOLUS (SEPSIS)
1000.0000 mL | Freq: Once | INTRAVENOUS | Status: AC
  Administered 2015-08-09: 1000 mL via INTRAVENOUS

## 2015-08-09 MED ORDER — IOPAMIDOL (ISOVUE-300) INJECTION 61%
100.0000 mL | Freq: Once | INTRAVENOUS | Status: AC | PRN
  Administered 2015-08-09: 100 mL via INTRAVENOUS
  Filled 2015-08-09: qty 100

## 2015-08-09 MED ORDER — MORPHINE SULFATE (PF) 4 MG/ML IV SOLN
4.0000 mg | Freq: Once | INTRAVENOUS | Status: AC
  Administered 2015-08-09: 4 mg via INTRAVENOUS
  Filled 2015-08-09: qty 1

## 2015-08-09 MED ORDER — DIATRIZOATE MEGLUMINE & SODIUM 66-10 % PO SOLN
15.0000 mL | Freq: Once | ORAL | Status: AC
  Administered 2015-08-09: 15 mL via ORAL

## 2015-08-09 NOTE — ED Provider Notes (Signed)
[facility] Emergency Department Provider Note  Time seen: 2:43 PM  I have reviewed the triage vital signs and the nursing notes.   HISTORY  Chief Complaint Flank Pain and Abdominal Pain    HPI Tina Blackburn is a 45 y.o. female with a past medical history of diabetes who presents the emergency department with left-sided abdominal pain.According to the patient she was seen here yesterday for left-sided abdominal pain. CT scan shows a possibility of a tiny kidney stone however there is not appear to be any obstructing ureteral stone. Patient was discharged home with close PCP follow-up. Patient followed up with her PCP this morning due to continued left-sided abdominal pain. Given the patient's discomfort this and the patient back to the emergency department for further evaluation. Patient describes her pain as moderate to severe located in the left abdomen to left lower quadrant. Denies any hematuria or dysuria. Denies any diarrhea, black or bloody stool. Denies any vomiting but does state occasional nausea. Denies vaginal discharge or bleeding.     Past Medical History  Diagnosis Date  . Diabetes mellitus without complication ([place])     There are no active problems to display for this patient.   Past Surgical History  Procedure Laterality Date  . Cholecystectomy      Current Outpatient Rx  Name  Route  Sig  Dispense  Refill  . butalbital-acetaminophen-caffeine (FIORICET) 50-325-40 MG tablet   Oral   Take 1-2 tablets by mouth every 6 (six) hours as needed for headache.   20 tablet   0   . HYDROcodone-acetaminophen (NORCO) 5-325 MG tablet   Oral   Take 1 tablet by mouth every 6 (six) hours as needed for moderate pain.   15 tablet   0   . ibuprofen (ADVIL,MOTRIN) 800 MG tablet   Oral   Take 1 tablet (800 mg total) by mouth every 8 (eight) hours as needed for moderate pain.   15 tablet   0   . metFORMIN (GLUCOPHAGE) 1000 MG tablet  Oral   Take 1,000 mg by mouth 2 (two) times daily with a meal.         . omeprazole (PRILOSEC) 40 MG capsule   Oral   Take 40 mg by mouth daily.         . promethazine (PHENERGAN) 25 MG tablet   Oral   Take 1 tablet (25 mg total) by mouth every 6 (six) hours as needed for nausea or vomiting.   20 tablet   0   . sitaGLIPtin (JANUVIA) 25 MG tablet   Oral   Take 25 mg by mouth daily.         . traMADol (ULTRAM) 50 MG tablet   Oral   Take 1 tablet (50 mg total) by mouth every 6 (six) hours as needed.   20 tablet   0     Allergies Review of patient's allergies indicates no known allergies.  No family history on file.  Social History Social History  Substance Use Topics  . Smoking status: Never Smoker   . Smokeless tobacco: Never Used  . Alcohol Use: No    Review of Systems Constitutional: Negative for fever. Cardiovascular: Negative for chest pain. Respiratory: Negative for shortness of breath. Gastrointestinal: Left-sided abdominal pain, left lower quadrant pain. Positive nausea. Negative for vomiting and diarrhea. Genitourinary: Negative for dysuria. Negative for hematuria. Musculoskeletal: Negative for back pain. Neurological: Negative for headache 10-point ROS otherwise negative.  ____________________________________________   PHYSICAL  EXAM:  VITAL SIGNS: ED Triage Vitals  Enc Vitals Group     BP 08/09/15 1240 113/75 mmHg     Pulse Rate 08/09/15 1240 81     Resp 08/09/15 1240 18     Temp 08/09/15 1240 98.1 F (36.7 C)     Temp Source 08/09/15 1240 Oral     SpO2 08/09/15 1240 98 %     Weight 08/09/15 1240 220 lb (99.791 kg)     Height 08/09/15 1240 5\' 2"  (1.575 m)     Head Cir --      Peak Flow --      Pain Score 08/09/15 1240 9     Pain Loc --      Pain Edu? --      Excl. in [place]? --     Constitutional: Alert and oriented. Well appearing and in no distress. Eyes: Normal exam ENT   Head: Normocephalic and atraumatic   Mouth/Throat:  Mucous membranes are moist. Cardiovascular: Normal rate, regular rhythm. No murmur Respiratory: Normal respiratory effort without tachypnea nor retractions. Breath sounds are clear  Gastrointestinal: Soft, moderate left lower quadrant redness to palpation. No rebound or guarding. No distention. Mild left CVA tenderness to palpation. Musculoskeletal: Nontender with normal range of motion in all extremities.  Neurologic:  Normal speech and language. No gross focal neurologic deficits  Skin:  Skin is warm, dry and intact.  Psychiatric: Mood and affect are normal.   ____________________________________________    EKG  EKG reviewed and interpreted by myself shows normal sinus rhythm at 89 bpm, narrow QRS, normal axis, normal intervals, no ST changes. Normal EKG.  ____________________________________________    RADIOLOGY  CT abdomen/pelvis with contrast  ____________________________________________    INITIAL IMPRESSION / ASSESSMENT AND PLAN / ED COURSE  Pertinent labs & imaging results that were available during my care of the patient were reviewed by me and considered in my medical decision making (see chart for details).  Patient with continued left lower quadrant pain, moderate tenderness to palpation. Patient does appear uncomfortable. Given the patient's largely normal workup yesterday with an overall non-concerning CT scan, we'll repeat lab work today including LFTs, lipase, repeat a CT scan this time with IV and oral contrast. Patient agreeable to plan.  Patient's workup today including CT abdomen/pelvis with contrast does not show any acute abnormality. At this point I do not have a clear cause for the patient's pain besides possible musculoskeletal pain. Patient had a urinalysis performed yesterday that was normal. Patient blood work today is largely normal.  Patient continues to feel pain but states it is somewhat improved. I discussed the results with the patient and her  husband. They will follow up with GI medicine. I discussed strict return precautions if the pain worsens or she develops a fever she is to return to the emergency department for further workup. At this time I do not have a clear diagnosis for the patient's abdominal pain.  ____________________________________________   FINAL CLINICAL IMPRESSION(S) / ED DIAGNOSES  Left lower quadrant pain   Harvest Dark, MD 08/09/15 1733

## 2015-08-09 NOTE — ED Notes (Signed)
Tele-interpreter used for triage.  Tina Blackburn [phone]).

## 2015-08-09 NOTE — ED Notes (Signed)
Patient presents to the ED with left flank pain.  Patient states she was diagnosed with kidney stones and given pain medication and states that the medication is not working.  Patient states she went to [facility] for follow-[facility] and was sent to the ED.  Patient has been taking ibuprofen and hydrocodone.  Patient states pain is constant.  And pain is in her left upper quadrant as well as left flank.  Patient appears uncomfortable in triage.  Patient denies vomiting and diarrhea.

## 2015-08-09 NOTE — ED Notes (Signed)
Patient resting in bed, finished with contrast. NAD noted. Warm blanket given and CT notified. No further needs expressed at this time.

## 2015-08-09 NOTE — Discharge Instructions (Signed)
As we discussed please call the number provided to make an appointment with GI medicine if your symptoms not improve over the next 24 hours. If your symptoms worsen or you develop a fever please return to the emergency department for further evaluation. Otherwise please follow-up with her primary care physician in one to 2 days for recheck/reevaluation.   Dolor abdominal en adultos (Abdominal Pain, Adult) El dolor puede tener muchas causas. Normalmente la causa del dolor abdominal no es una enfermedad y Teacher, English as a foreign language sin Clinical research associate. Frecuentemente puede controlarse y tratarse en casa. Su mdico le Chartered certified accountant examen fsico y posiblemente solicite anlisis de sangre y radiografas para ayudar a Teacher, adult education la gravedad de su dolor. Sin embargo, en [employer], debe transcurrir ms tiempo antes de que se pueda Pension scheme manager una causa evidente del dolor. Antes de llegar a ese punto, es posible que su mdico no sepa si necesita ms pruebas o un tratamiento ms profundo. INSTRUCCIONES PARA EL CUIDADO EN EL HOGAR  Est atento al dolor para ver si hay cambios. Las siguientes indicaciones ayudarn a Chief Strategy Officer que pueda sentir:  [place] solo medicamentos de venta libre o recetados, segn las indicaciones del mdico.  No tome laxantes a menos que se lo haya indicado su mdico.  Pruebe con Ardelia Mems dieta lquida absoluta (caldo, t o agua) segn se lo indique su mdico. Introduzca gradualmente una dieta normal, segn su tolerancia. SOLICITE ATENCIN MDICA SI:  Tiene dolor abdominal sin explicacin.  Tiene dolor abdominal relacionado con nuseas o diarrea.  Tiene dolor cuando orina o defeca.  Experimenta dolor abdominal que lo despierta de noche.  Tiene dolor abdominal que empeora o mejora cuando come alimentos.  Tiene dolor abdominal que empeora cuando come alimentos grasosos.  Tiene fiebre. SOLICITE ATENCIN MDICA DE INMEDIATO SI:   El dolor no desaparece en un plazo mximo de 2horas.  No  deja de (vomitar).  El Social research officer, government se siente solo en partes del abdomen, como el lado derecho o la parte inferior izquierda del abdomen.  Evaca materia fecal sanguinolenta o negra, de aspecto alquitranado. ASEGRESE DE QUE:  Comprende estas instrucciones.  Controlar su afeccin.  Recibir ayuda de inmediato si no mejora o si empeora.   Esta informacin no tiene Marine scientist el consejo del mdico. Asegrese de hacerle al mdico cualquier pregunta que tenga.   Document Released: 03/25/2005 Document Revised: 04/15/2014 Elsevier Interactive Patient Education [employer].

## 2016-03-06 ENCOUNTER — Emergency Department: Payer: Self-pay

## 2016-03-06 ENCOUNTER — Encounter: Payer: Self-pay | Admitting: *Deleted

## 2016-03-06 ENCOUNTER — Emergency Department
Admission: EM | Admit: 2016-03-06 | Discharge: 2016-03-06 | Disposition: A | Discharge status: Home / Self Care | Payer: Self-pay | Attending: Emergency Medicine | Admitting: Emergency Medicine

## 2016-03-06 DIAGNOSIS — M5432 Sciatica, left side: Secondary | ICD-10-CM | POA: Insufficient documentation

## 2016-03-06 DIAGNOSIS — R109 Unspecified abdominal pain: Secondary | ICD-10-CM

## 2016-03-06 DIAGNOSIS — Z794 Long term (current) use of insulin: Secondary | ICD-10-CM | POA: Insufficient documentation

## 2016-03-06 DIAGNOSIS — E119 Type 2 diabetes mellitus without complications: Secondary | ICD-10-CM | POA: Insufficient documentation

## 2016-03-06 DIAGNOSIS — N3001 Acute cystitis with hematuria: Secondary | ICD-10-CM | POA: Insufficient documentation

## 2016-03-06 DIAGNOSIS — N200 Calculus of kidney: Secondary | ICD-10-CM | POA: Insufficient documentation

## 2016-03-06 LAB — URINALYSIS COMPLETE WITH MICROSCOPIC (ARMC ONLY)
Bilirubin Urine: NEGATIVE
Glucose, UA: >500 mg/dL — AB
HGB URINE DIPSTICK: NEGATIVE
Ketones, ur: NEGATIVE mg/dL
LEUKOCYTES UA: NEGATIVE
NITRITE: NEGATIVE
PROTEIN: NEGATIVE mg/dL
SPECIFIC GRAVITY, URINE: 1.021 (ref 1.005–1.030)
pH: 5 (ref 5.0–8.0)

## 2016-03-06 LAB — CBC
HEMATOCRIT: 34.9 % — AB (ref 35.0–47.0)
HEMOGLOBIN: 11.2 g/dL — AB (ref 12.0–16.0)
MCH: 22.2 pg — ABNORMAL LOW (ref 26.0–34.0)
MCHC: 32.1 g/dL (ref 32.0–36.0)
MCV: 69.3 fL — ABNORMAL LOW (ref 80.0–100.0)
Platelets: 223 10*3/uL (ref 150–440)
RBC: 5.04 MIL/uL (ref 3.80–5.20)
RDW: 17.3 % — ABNORMAL HIGH (ref 11.5–14.5)
WBC: 7.9 10*3/uL (ref 3.6–11.0)

## 2016-03-06 LAB — BASIC METABOLIC PANEL
ANION GAP: 8 (ref 5–15)
BUN: 12 mg/dL (ref 6–20)
CHLORIDE: 102 mmol/L (ref 101–111)
CO2: 22 mmol/L (ref 22–32)
Calcium: 9 mg/dL (ref 8.9–10.3)
Creatinine, Ser: 0.53 mg/dL (ref 0.44–1.00)
GFR calc Af Amer: >60 mL/min (ref >60)
GLUCOSE: 354 mg/dL — AB (ref 65–99)
POTASSIUM: 4.2 mmol/L (ref 3.5–5.1)
Sodium: 132 mmol/L — ABNORMAL LOW (ref 135–145)

## 2016-03-06 LAB — GLUCOSE, CAPILLARY: GLUCOSE-CAPILLARY: 306 mg/dL — AB (ref 65–99)

## 2016-03-06 MED ORDER — CEPHALEXIN 500 MG PO CAPS
500.0000 mg | ORAL_CAPSULE | Freq: Two times a day (BID) | ORAL | 0 refills | Status: DC
Start: 2016-03-06 — End: 2016-11-28

## 2016-03-06 MED ORDER — DEXTROSE 5 % IV SOLN: 1.0000 g | Freq: Once | INTRAVENOUS | Status: DC

## 2016-03-06 MED ORDER — IBUPROFEN 800 MG PO TABS: 800.0000 mg | ORAL_TABLET | Freq: Three times a day (TID) | ORAL | 0 refills | Status: DC | PRN

## 2016-03-06 MED ORDER — ONDANSETRON HCL 4 MG/2ML IJ SOLN
4.0000 mg | Freq: Once | INTRAMUSCULAR | Status: AC
  Administered 2016-03-06: 4 mg via INTRAVENOUS
  Filled 2016-03-06: qty 2

## 2016-03-06 MED ORDER — MORPHINE SULFATE (PF) 4 MG/ML IV SOLN
4.0000 mg | Freq: Once | INTRAVENOUS | Status: AC
  Administered 2016-03-06: 4 mg via INTRAVENOUS
  Filled 2016-03-06: qty 1

## 2016-03-06 MED ORDER — SODIUM CHLORIDE 0.9 % IV BOLUS (SEPSIS)
1000.0000 mL | Freq: Once | INTRAVENOUS | Status: AC
  Administered 2016-03-06: 1000 mL via INTRAVENOUS

## 2016-03-06 MED ORDER — CEFTRIAXONE SODIUM-DEXTROSE 1-3.74 GM-% IV SOLR
1.0000 g | Freq: Once | INTRAVENOUS | Status: AC
  Administered 2016-03-06: 1 g via INTRAVENOUS
  Filled 2016-03-06: qty 50

## 2016-03-06 NOTE — ED Triage Notes (Addendum)
States hx of DM, states she started insulin earlier this month and sugars have not been below 200, states sugar this AM was in the 400s, pt was on metformin and glipizide before insulin, states left lower back pain, hx of kidney stones, denies any urinary symptoms, states she takes insulin in evenings

## 2016-03-06 NOTE — ED Notes (Signed)
Pt reports that she began having left sided flank pain 2 days ago. In the last month she has been placed on insulin also and pt reports that her sugars have not been below 200 since then.

## 2016-03-06 NOTE — ED Notes (Signed)
Pt taken to US

## 2016-03-06 NOTE — ED Provider Notes (Signed)
[facility] Emergency Department Provider Note ____________________________________________   I have reviewed the triage vital signs and the triage nursing note.  HISTORY  Chief Complaint Hyperglycemia   Historian Patient and daughter who provides some interpreting Spanish interpreter was used.  HPI Tina Blackburn is a 45 y.o. female with a history of diabetes and history of kidney stones presents today with left flank pain that she states comes around her left abdomen but mostly down her buttock and down into the upper part of her left leg. Symptoms have been ongoing now for what sounds like maybe a day or 2. She is also reporting some mild dysuria.  Mild nausea without vomiting. No diarrhea. No fever. This does not feel classically like kidney stone to her.  Pain seems worse with movement.    Past Medical History:  Diagnosis Date  . Diabetes mellitus without complication ([place])     There are no active problems to display for this patient.   Past Surgical History:  Procedure Laterality Date  . CHOLECYSTECTOMY      Prior to Admission medications   Medication Sig Start Date End Date Taking? Authorizing Provider  atorvastatin (LIPITOR) 20 MG tablet Take 20 mg by mouth daily.   Yes Historical Provider, MD  cetirizine (ZYRTEC) 10 MG tablet Take 10 mg by mouth.   Yes Historical Provider, MD  insulin detemir (LEVEMIR) 100 UNIT/ML injection Inject 60 Units into the skin at bedtime.   Yes Historical Provider, MD  omeprazole (PRILOSEC) 40 MG capsule Take 40 mg by mouth daily.   Yes Historical Provider, MD  cephALEXin (KEFLEX) 500 MG capsule Take 1 capsule (500 mg total) by mouth 2 (two) times daily. 03/06/16   Lisa Roca, MD  ibuprofen (ADVIL,MOTRIN) 800 MG tablet Take 1 tablet (800 mg total) by mouth every 8 (eight) hours as needed. 03/06/16   Lisa Roca, MD    No Known Allergies  History reviewed. No pertinent family history.  Social  History Social History  Substance Use Topics  . Smoking status: Never Smoker  . Smokeless tobacco: Never Used  . Alcohol use No    Review of Systems  Constitutional: Negative for fever. Eyes: Negative for visual changes. ENT: Negative for sore throat. Cardiovascular: Negative for chest pain. Respiratory: Negative for shortness of breath. Gastrointestinal: Negative for abdominal pain, vomiting and diarrhea. Genitourinary: Positive for dysuria. Musculoskeletal: Positive for left flank pain. No midline back pain.  Skin: Negative for rash. Neurological: Negative for headache. 10 point Review of Systems otherwise negative ____________________________________________   PHYSICAL EXAM:  VITAL SIGNS: ED Triage Vitals  Enc Vitals Group     BP 03/06/16 0928 135/70     Pulse Rate 03/06/16 0928 98     Resp 03/06/16 0928 18     Temp 03/06/16 0928 98.2 F (36.8 C)     Temp Source 03/06/16 0928 Oral     SpO2 03/06/16 0928 98 %     Weight 03/06/16 0929 220 lb (99.8 kg)     Height 03/06/16 0929 5\' 1"  (1.549 m)     Head Circumference --      Peak Flow --      Pain Score 03/06/16 0929 7     Pain Loc --      Pain Edu? --      Excl. in [place]? --      Constitutional: Alert and oriented. Well appearing and in no distress. HEENT   Head: Normocephalic and atraumatic.  Eyes: Conjunctivae are normal. PERRL. Normal extraocular movements.      Ears:         Nose: No congestion/rhinnorhea.   Mouth/Throat: Mucous membranes are moist.   Neck: No stridor. Cardiovascular/Chest: Normal rate, regular rhythm.  No murmurs, rubs, or gallops. Respiratory: Normal respiratory effort without tachypnea nor retractions. Breath sounds are clear and equal bilaterally. No wheezes/rales/rhonchi. Gastrointestinal: Soft. No distention, no guarding, no rebound. Nontender.  Obese  Genitourinary/rectal:Deferred Musculoskeletal: Some tenderness to the left flank and back soft tissues down into the mid  buttock. Nontender with normal range of motion in all extremities. No joint effusions.  No lower extremity tenderness.  No edema. Neurologic:  Normal speech and language. No gross or focal neurologic deficits are appreciated. Skin:  Skin is warm, dry and intact. No rash noted. Psychiatric: Mood and affect are normal. Speech and behavior are normal. Patient exhibits appropriate insight and judgment.   ____________________________________________  LABS (pertinent positives/negatives)  Labs Reviewed  BASIC METABOLIC PANEL - Abnormal; Notable for the following:       Result Value   Sodium 132 (*)    Glucose, Bld 354 (*)    All other components within normal limits  CBC - Abnormal; Notable for the following:    Hemoglobin 11.2 (*)    HCT 34.9 (*)    MCV 69.3 (*)    MCH 22.2 (*)    RDW 17.3 (*)    All other components within normal limits  URINALYSIS COMPLETEWITH MICROSCOPIC ([facility] ONLY) - Abnormal; Notable for the following:    Color, Urine STRAW (*)    APPearance CLEAR (*)    Glucose, UA >500 (*)    Bacteria, UA MANY (*)    Squamous Epithelial / LPF 0-5 (*)    All other components within normal limits  GLUCOSE, CAPILLARY - Abnormal; Notable for the following:    Glucose-Capillary 306 (*)    All other components within normal limits  URINE CULTURE  CBG MONITORING, ED    ____________________________________________    EKG I, Lisa Roca, MD, the attending physician have personally viewed and interpreted all ECGs.  None ____________________________________________  RADIOLOGY All Xrays were viewed by me. Imaging interpreted by Radiologist.  Renal ultrasound: Study within normal limits __________________________________________  PROCEDURES  Procedure(s) performed: None  Critical Care performed: None  ____________________________________________   ED COURSE / ASSESSMENT AND PLAN  Pertinent labs & imaging results that were available during my care of the patient  were reviewed by me and considered in my medical decision making (see chart for details).   Initially left flank discomfort seemed to be more likely related to dysuria and possible UTI, and urinalysis does have leukocytes and red blood cells, and I discussed with her treating for possible urinary tract infection and sending a culture. She didn't have any evidence of sepsis.  I think less likely kidney stone.  Ultimately though she does have some symptoms that go down into her buttock and down into her legs are more consistent with sciatica.  I think her symptoms seem to be consistent with acute spinal emergency, and she has no weakness or numbness but have discussed return precautions with respect to that.  I'm treating her with Keflex, for possible urinary infection.  I've discussed short course of antiinflammatory ibuprofen for likely sciatica.     CONSULTATIONS:  None   Patient / Family / Caregiver informed of clinical course, medical decision-making process, and agree with plan.   I discussed return precautions, follow-up instructions,  and discharge instructions with patient and/or family.   ___________________________________________   FINAL CLINICAL IMPRESSION(S) / ED DIAGNOSES   Final diagnoses:  Acute flank pain  Sciatica, left side  Acute cystitis with hematuria              Note: This dictation was prepared with Dragon dictation. Any transcriptional errors that result from this process are unintentional    Lisa Roca, MD 03/06/16 [phone]

## 2016-03-06 NOTE — ED Notes (Signed)
Discharge instructions reviewed with patient with assistance of interpreter. Patient verbalized understanding. Patient ambulated to lobby without difficulty.

## 2016-03-06 NOTE — ED Notes (Signed)
Pt ambulatory to toilet

## 2016-03-06 NOTE — Discharge Instructions (Addendum)
Return to the emergency room for worsening pain, fever, numbness, weakness, incontinence of urine or stool or inability to urinate Vuelvase a la sala de emergencias si se le empeora el dolor, si tiene fiebre, hormigueo, debilidad, si no puede aguantar la orina o el excremento o si no Radiographer, therapeutic.

## 2016-03-07 LAB — URINE CULTURE

## 2016-11-28 ENCOUNTER — Emergency Department
Admission: EM | Admit: 2016-11-28 | Discharge: 2016-11-28 | Disposition: A | Discharge status: Home / Self Care | Payer: Self-pay | Attending: Emergency Medicine | Admitting: Emergency Medicine

## 2016-11-28 ENCOUNTER — Encounter: Payer: Self-pay | Admitting: Emergency Medicine

## 2016-11-28 ENCOUNTER — Emergency Department: Payer: Self-pay

## 2016-11-28 DIAGNOSIS — Z794 Long term (current) use of insulin: Secondary | ICD-10-CM | POA: Insufficient documentation

## 2016-11-28 DIAGNOSIS — R519 Headache, unspecified: Secondary | ICD-10-CM

## 2016-11-28 DIAGNOSIS — N3000 Acute cystitis without hematuria: Secondary | ICD-10-CM | POA: Insufficient documentation

## 2016-11-28 DIAGNOSIS — R51 Headache: Secondary | ICD-10-CM | POA: Insufficient documentation

## 2016-11-28 DIAGNOSIS — Z79899 Other long term (current) drug therapy: Secondary | ICD-10-CM | POA: Insufficient documentation

## 2016-11-28 DIAGNOSIS — E119 Type 2 diabetes mellitus without complications: Secondary | ICD-10-CM | POA: Insufficient documentation

## 2016-11-28 HISTORY — DX: Benign neoplasm of brain, unspecified: D33.2

## 2016-11-28 LAB — CBC WITH DIFFERENTIAL/PLATELET
BASOS ABS: 0 10*3/uL (ref 0–0.1)
BASOS PCT: 0 %
Eosinophils Absolute: 0 10*3/uL (ref 0–0.7)
Eosinophils Relative: 0 %
HCT: 40.1 % (ref 35.0–47.0)
HEMOGLOBIN: 13.7 g/dL (ref 12.0–16.0)
Lymphocytes Relative: 30 %
Lymphs Abs: 2.8 10*3/uL (ref 1.0–3.6)
MCH: 29.3 pg (ref 26.0–34.0)
MCHC: 34.1 g/dL (ref 32.0–36.0)
MCV: 85.9 fL (ref 80.0–100.0)
Monocytes Absolute: 1 10*3/uL — ABNORMAL HIGH (ref 0.2–0.9)
Monocytes Relative: 11 %
NEUTROS PCT: 59 %
Neutro Abs: 5.7 10*3/uL (ref 1.4–6.5)
Platelets: 250 10*3/uL (ref 150–440)
RBC: 4.67 MIL/uL (ref 3.80–5.20)
RDW: 13.7 % (ref 11.5–14.5)
WBC: 9.6 10*3/uL (ref 3.6–11.0)

## 2016-11-28 LAB — BASIC METABOLIC PANEL
Anion gap: 9 (ref 5–15)
BUN: 8 mg/dL (ref 6–20)
CALCIUM: 9.6 mg/dL (ref 8.9–10.3)
CHLORIDE: 104 mmol/L (ref 101–111)
CO2: 26 mmol/L (ref 22–32)
CREATININE: 0.43 mg/dL — AB (ref 0.44–1.00)
Glucose, Bld: 159 mg/dL — ABNORMAL HIGH (ref 65–99)
POTASSIUM: 4.1 mmol/L (ref 3.5–5.1)
SODIUM: 139 mmol/L (ref 135–145)

## 2016-11-28 LAB — URINALYSIS, COMPLETE (UACMP) WITH MICROSCOPIC
Bilirubin Urine: NEGATIVE
Glucose, UA: 50 mg/dL — AB
Hgb urine dipstick: NEGATIVE
Ketones, ur: NEGATIVE mg/dL
LEUKOCYTES UA: NEGATIVE
Nitrite: NEGATIVE
PH: 6 (ref 5.0–8.0)
Protein, ur: NEGATIVE mg/dL
SPECIFIC GRAVITY, URINE: 1.016 (ref 1.005–1.030)

## 2016-11-28 LAB — POCT PREGNANCY, URINE: Preg Test, Ur: NEGATIVE

## 2016-11-28 MED ORDER — DIPHENHYDRAMINE HCL 50 MG/ML IJ SOLN
12.5000 mg | Freq: Once | INTRAMUSCULAR | Status: AC
  Administered 2016-11-28: 12.5 mg via INTRAVENOUS
  Filled 2016-11-28: qty 1

## 2016-11-28 MED ORDER — PROCHLORPERAZINE MALEATE 10 MG PO TABS: 10.0000 mg | ORAL_TABLET | Freq: Four times a day (QID) | ORAL | 0 refills | Status: DC | PRN

## 2016-11-28 MED ORDER — CEPHALEXIN 500 MG PO CAPS: 500.0000 mg | ORAL_CAPSULE | Freq: Three times a day (TID) | ORAL | 0 refills | Status: AC

## 2016-11-28 MED ORDER — CEPHALEXIN 500 MG PO CAPS
500.0000 mg | ORAL_CAPSULE | Freq: Once | ORAL | Status: AC
  Administered 2016-11-28: 500 mg via ORAL
  Filled 2016-11-28: qty 1

## 2016-11-28 MED ORDER — GADOBENATE DIMEGLUMINE 529 MG/ML IV SOLN
20.0000 mL | Freq: Once | INTRAVENOUS | Status: AC | PRN
  Administered 2016-11-28: 19 mL via INTRAVENOUS

## 2016-11-28 MED ORDER — PROCHLORPERAZINE EDISYLATE 5 MG/ML IJ SOLN
2.5000 mg | Freq: Once | INTRAMUSCULAR | Status: AC
  Administered 2016-11-28: 2.5 mg via INTRAVENOUS
  Filled 2016-11-28: qty 2

## 2016-11-28 NOTE — ED Notes (Signed)
Pt taken to MRI at this time

## 2016-11-28 NOTE — ED Notes (Signed)
Lights dimmed for patient comfort at this time. Will continue to monitor for further patient needs.

## 2016-11-28 NOTE — ED Triage Notes (Addendum)
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

## 2016-11-28 NOTE — ED Notes (Signed)
NAD noted at time of D/C. Pt denies questions or concerns. Pt ambulatory to the lobby at this time.  

## 2016-11-28 NOTE — ED Notes (Signed)
Pt instructed to call her daughter. Pt visualized in NAD, resting in bed with eyes closed, respirations even and unlabored. Will D/C when patient's daughter arrives to pick her up.

## 2016-11-28 NOTE — Discharge Instructions (Addendum)
Su MRI permanece sin cambios. Puede tomar Compazine segn sea necesario para dolores de [place]. Regrese a la sala de emergencias para [place] sntomas, vmitos persistentes, letargo u otras preocupaciones. Tambin se encontr que tena una infeccin en la [place]. [place] el antibitico prescrito 3 veces por da por 7 das. Haga un seguimiento con su mdico en 2-3 das. Regrese a la sala de emergencias por dolor abdominal, fiebre, nuseas, vmitos o dolor de espalda.  Your MRI remains unchanged. You may take Compazine as needed for headaches. Return to the ER for worsening symptoms, persistent vomiting, lethargy or other concerns. You were also found to have an infection in your urine. Take the antibiotic prescribed 3 times a dya for 7 days. Follow up with your doctor in 2-3 days. Return to the ER for abdominal pain, fever, nausea, vomiting, or back pain.

## 2016-11-28 NOTE — ED Provider Notes (Signed)
-----------------------------------------   8:23 AM on 11/28/2016 -----------------------------------------   Blood pressure 120/74, pulse 86, temperature 98.7 F (37.1 C), temperature source Oral, resp. rate 18, height 5\' 1"  (1.549 m), weight 90.7 kg (200 lb), last menstrual period 11/18/2016, SpO2 98 %.  Assuming care from Dr. Beather Arbour of [place] is a 46 y.o. female with a chief complaint of Headache .    Please refer to H&P by previous MD for further details.  Patient discharged by Dr. Beather Arbour awaiting ride home when my shift started. I was informed by the nurse that patient's UA was back showing bacteria in it. Patient is complaining of mild dysuria since last night. Patient was started on keflex.     Alfred Levins, [place], MD 11/28/16 [phone]

## 2016-11-28 NOTE — ED Notes (Signed)
This RN spoke with Dr. Alfred Levins regarding many bacteria in urine, Dr. Alfred Levins to bedside, when asked, pt c/o suprapubic pain that she did not complain about initially. Per Dr. Alfred Levins, pt will be D/C with Keflex. Pt denies being on abx at home.

## 2016-11-28 NOTE — ED Provider Notes (Signed)
[facility] Emergency Department Provider Note   ____________________________________________   First MD Initiated Contact with Patient 11/28/16 [phone]     (approximate)  I have reviewed the triage vital signs and the nursing notes.   HISTORY  Chief Complaint Headache  Declines interpreter; family member interprets for her  HPI Tina Blackburn is a 45 y.o. female who presents to the ED from home with a chief complaint of headache. Patient has a history of meningioma followed by neurology. Family member tells me there unable to afford neurosurgery follow-upthat was scheduled for her back in February. Reports frequent headaches for which she is prescribed Naprosyn. Reports awakening yesterday approximately 9 AM with left-sided headache accompanied by nausea and vomiting. Patient endorses photophobia and dizziness like the room is spinning. Also states the left side of her face feels numb. Denies altered mental status, slurred speech, extremity weakness. Denies fever, chills, chest pain, shortness of breath, abdominal pain, dysuria, diarrhea. Denies recent travel or trauma.   Past Medical History:  Diagnosis Date  . Brain tumor (benign) ([place])   . Diabetes mellitus without complication ([place])     There are no active problems to display for this patient.   Past Surgical History:  Procedure Laterality Date  . CHOLECYSTECTOMY      Prior to Admission medications   Medication Sig Start Date End Date Taking? Authorizing Provider  atorvastatin (LIPITOR) 20 MG tablet Take 20 mg by mouth daily.    [provider]  cephALEXin (KEFLEX) 500 MG capsule Take 1 capsule (500 mg total) by mouth 2 (two) times daily. 03/06/16   Lisa Roca, MD  cetirizine (ZYRTEC) 10 MG tablet Take 10 mg by mouth.    [provider]  ibuprofen (ADVIL,MOTRIN) 800 MG tablet Take 1 tablet (800 mg total) by mouth every 8 (eight) hours as needed. 03/06/16   Lisa Roca, MD  insulin detemir (LEVEMIR) 100 UNIT/ML injection Inject 60 Units into the skin at bedtime.    [provider]  omeprazole (PRILOSEC) 40 MG capsule Take 40 mg by mouth daily.    [provider]    Allergies Patient has no known allergies.  No family history on file.  Social History Social History  Substance Use Topics  . Smoking status: Never Smoker  . Smokeless tobacco: Never Used  . Alcohol use No    Review of Systems  Constitutional: No fever/chills. Eyes: No visual changes. ENT: No sore throat. Cardiovascular: Denies chest pain. Respiratory: Denies shortness of breath. Gastrointestinal: No abdominal pain.  No nausea, no vomiting.  No diarrhea.  No constipation. Genitourinary: Negative for dysuria. Musculoskeletal: Negative for back pain. Skin: Negative for rash. Neurological: Positive for headache. Negative for focal weakness. Positive for left facial numbness.   ____________________________________________   PHYSICAL EXAM:  VITAL SIGNS: ED Triage Vitals [11/28/16 0247]  Enc Vitals Group     BP (!) 147/93     Pulse Rate (!) 103     Resp 20     Temp 98.7 F (37.1 C)     Temp Source Oral     SpO2 97 %     Weight 200 lb (90.7 kg)     Height 5\' 1"  (1.549 m)     Head Circumference      Peak Flow      Pain Score      Pain Loc      Pain Edu?      Excl. in [place]?  Constitutional: Alert and oriented. Well appearing and in mild acute distress. Tearful, holding her head. Eyes: Conjunctivae are normal. PERRL. EOMI. Head: Atraumatic. Nose: No congestion/rhinnorhea. Mouth/Throat: Mucous membranes are moist.  Oropharynx non-erythematous. Neck: No stridor.  Supple neck without meningismus.  No carotid bruits. Cardiovascular: Normal rate, regular rhythm. Grossly normal heart sounds.  Good peripheral circulation. Respiratory: Normal respiratory effort.  No retractions. Lungs CTAB. Gastrointestinal: Soft and nontender. No distention. No  abdominal bruits. No CVA tenderness. Musculoskeletal: No lower extremity tenderness nor edema.  No joint effusions. Neurologic:  Alert and oriented 4. CN II-XII grossly intact. Normal speech and language. No gross focal neurologic deficits are appreciated.  Skin:  Skin is warm, dry and intact. No rash noted. Psychiatric: Mood and affect are normal. Speech and behavior are normal.  ____________________________________________   LABS (all labs ordered are listed, but only abnormal results are displayed)  Labs Reviewed  CBC WITH DIFFERENTIAL/PLATELET - Abnormal; Notable for the following:       Result Value   Monocytes Absolute 1.0 (*)    All other components within normal limits  BASIC METABOLIC PANEL - Abnormal; Notable for the following:    Glucose, Bld 159 (*)    Creatinine, Ser 0.43 (*)    All other components within normal limits  URINALYSIS, COMPLETE (UACMP) WITH MICROSCOPIC - Abnormal; Notable for the following:    Color, Urine YELLOW (*)    APPearance HAZY (*)    Glucose, UA 50 (*)    Bacteria, UA MANY (*)    Squamous Epithelial / LPF 0-5 (*)    All other components within normal limits  POCT PREGNANCY, URINE   ____________________________________________  EKG  none ____________________________________________  RADIOLOGY  Mr Jeri Cos And Wo Contrast  Result Date: 11/28/2016 CLINICAL DATA:  CC he left facial pain.  Nausea and dizziness. EXAM: MRI HEAD WITHOUT AND WITH CONTRAST TECHNIQUE: Multiplanar, multiecho pulse sequences of the brain and surrounding structures were obtained without and with intravenous contrast. CONTRAST:  69mL MULTIHANCE GADOBENATE DIMEGLUMINE 529 MG/ML IV SOLN COMPARISON:  Head CT 03/20/2015 FINDINGS: Brain: The midline structures are normal. There is no focal diffusion restriction to indicate acute infarct. There is multifocal hyperintense T2-weighted signal within the periventricular white matter, which may be seen in the setting of migraine  headaches or early chronic microvascular disease; however, it is also seen in normal patients of this age. Falcine meningioma measures 14 x 12 mm, unchanged. No underlying brain signal change. No intraparenchymal hematoma or chronic microhemorrhage. Brain volume is normal for age without lobar predominant atrophy. The dura is normal and there is no extra-axial collection. Vascular: Major intracranial arterial and venous sinus flow voids are preserved. Skull and upper cervical spine: The visualized skull base, calvarium, upper cervical spine and extracranial soft tissues are normal. Sinuses/Orbits: No fluid levels or advanced mucosal thickening. No mastoid or middle ear effusion. Normal orbits. IMPRESSION: 1. Unchanged size of inferior falcine meningioma without adjacent parenchymal edema. 2. Nonspecific white matter hyperintense foci, which may be seen in chronic hypertensive microangiopathy or migraine headaches. Electronically Signed   By: Ulyses Jarred M.D.   On: 11/28/2016 06:40    ____________________________________________   PROCEDURES  Procedure(s) performed: None  Procedures  Critical Care performed: No  ____________________________________________   INITIAL IMPRESSION / ASSESSMENT AND PLAN / ED COURSE  Pertinent labs & imaging results that were available during my care of the patient were reviewed by me and considered in my medical decision making (see chart for details).  46 year old female with diabetes and meningioma who presents with left-sided headache with facial numbness. No focal neurological deficits on examination. Will check screening lab work, administer analgesia/antiemetic. Records review reveal last MRI was 2 years ago; looks like patient has had meningiomas and since 2013. Will repeat MRI brain this morning.  Clinical Course as of Nov 29 650  Thu Nov 28, 2016  0647 Patient is sleeping soundly in no acute distress. Awakened her to update her of laboratory,  urinalysis and MRI results. Will discharge home with prescription for Compazine, she will follow-up with her doctor closely. Strict return precautions given. Patient verbalizes understanding and agrees with plan of care.  [JS]    Clinical Course User Index [JS] Paulette Blanch, MD     ____________________________________________   FINAL CLINICAL IMPRESSION(S) / ED DIAGNOSES  Final diagnoses:  Acute nonintractable headache, unspecified headache type      NEW MEDICATIONS STARTED DURING THIS VISIT:  New Prescriptions   No medications on file     Note:  This document was prepared using Dragon voice recognition software and may include unintentional dictation errors.    Paulette Blanch, MD 11/28/16 [phone]

## 2016-11-29 LAB — URINE CULTURE

## 2017-05-13 ENCOUNTER — Encounter: Payer: Self-pay | Admitting: Emergency Medicine

## 2017-05-13 ENCOUNTER — Emergency Department
Admission: EM | Admit: 2017-05-13 | Discharge: 2017-05-13 | Disposition: A | Discharge status: Home / Self Care | Payer: Self-pay | Attending: Emergency Medicine | Admitting: Emergency Medicine

## 2017-05-13 ENCOUNTER — Other Ambulatory Visit: Payer: Self-pay

## 2017-05-13 ENCOUNTER — Emergency Department: Payer: Self-pay

## 2017-05-13 DIAGNOSIS — Z79899 Other long term (current) drug therapy: Secondary | ICD-10-CM | POA: Insufficient documentation

## 2017-05-13 DIAGNOSIS — J029 Acute pharyngitis, unspecified: Secondary | ICD-10-CM

## 2017-05-13 DIAGNOSIS — J069 Acute upper respiratory infection, unspecified: Secondary | ICD-10-CM | POA: Insufficient documentation

## 2017-05-13 DIAGNOSIS — B9789 Other viral agents as the cause of diseases classified elsewhere: Secondary | ICD-10-CM | POA: Insufficient documentation

## 2017-05-13 DIAGNOSIS — Z794 Long term (current) use of insulin: Secondary | ICD-10-CM | POA: Insufficient documentation

## 2017-05-13 DIAGNOSIS — E119 Type 2 diabetes mellitus without complications: Secondary | ICD-10-CM | POA: Insufficient documentation

## 2017-05-13 LAB — INFLUENZA PANEL BY PCR (TYPE A & B)
Influenza A By PCR: NEGATIVE
Influenza B By PCR: NEGATIVE

## 2017-05-13 LAB — GROUP A STREP BY PCR: Group A Strep by PCR: NOT DETECTED

## 2017-05-13 MED ORDER — IBUPROFEN 600 MG PO TABS
600.0000 mg | ORAL_TABLET | Freq: Once | ORAL | Status: AC
  Administered 2017-05-13: 600 mg via ORAL
  Filled 2017-05-13: qty 1

## 2017-05-13 MED ORDER — HYDROCODONE-HOMATROPINE 5-1.5 MG/5ML PO SYRP: 5.0000 mL | ORAL_SOLUTION | Freq: Four times a day (QID) | ORAL | 0 refills | Status: DC | PRN

## 2017-05-13 MED ORDER — DEXAMETHASONE SODIUM PHOSPHATE 10 MG/ML IJ SOLN
10.0000 mg | Freq: Once | INTRAMUSCULAR | Status: AC
  Administered 2017-05-13: 10 mg via INTRAMUSCULAR
  Filled 2017-05-13: qty 1

## 2017-05-13 MED ORDER — ACETAMINOPHEN 500 MG PO TABS
1000.0000 mg | ORAL_TABLET | Freq: Once | ORAL | Status: AC
  Administered 2017-05-13: 1000 mg via ORAL
  Filled 2017-05-13: qty 2

## 2017-05-13 MED ORDER — GI COCKTAIL ~~LOC~~
30.0000 mL | Freq: Once | ORAL | Status: AC
  Administered 2017-05-13: 30 mL via ORAL
  Filled 2017-05-13: qty 30

## 2017-05-13 NOTE — Discharge Instructions (Signed)

## 2017-05-13 NOTE — ED Triage Notes (Signed)
Pt presents to ED with worsening cough and sore throat for the past 2 days. Took otc medication with no relief.

## 2017-05-13 NOTE — ED Provider Notes (Signed)
[facility] Emergency Department Provider Note  ____________________________________________   First MD Initiated Contact with Patient 05/13/17 [phone]     (approximate)  I have reviewed the triage vital signs and the nursing notes.   HISTORY  Chief Complaint Sore Throat  The patient and/or family speak(s) Spanish.  They understand they have the right to the use of a hospital interpreter, however at this time they prefer to speak directly with me in [place].  They know that they can ask for an interpreter at any time.   HPI Tina Blackburn is a 47 y.o. female with a history of insulin-dependent diabetes who presents for evaluation of gradually worsening sore throat and cough over the last 2 days.  She reports that the sore throat is very severe and it is extremely painful whenever she tries to swallow anything.  Her cough is very noticeable in the exam room as well and reportedly it is occasionally productive.  She has body aches all over, general malaise and fatigue, decreased appetite, and she reports that all of her symptoms are severe.  Over-the-counter medicine has not helped her.  She denies chest pain, shortness of breath, abdominal pain, nausea, vomiting, and diarrhea.  She has had decreased appetite.  She has no family members who have been ill.  She has not gotten the flu vaccination this year.  Past Medical History:  Diagnosis Date  . Brain tumor (benign) ([place])   . Diabetes mellitus without complication ([place])     There are no active problems to display for this patient.   Past Surgical History:  Procedure Laterality Date  . CHOLECYSTECTOMY      Prior to Admission medications   Medication Sig Start Date End Date Taking? Authorizing Provider  atorvastatin (LIPITOR) 20 MG tablet Take 20 mg by mouth daily.    [provider]  cetirizine (ZYRTEC) 10 MG tablet Take 10 mg by mouth.    [provider]  HYDROcodone-homatropine  (HYCODAN) 5-1.5 MG/5ML syrup Take 5 mLs by mouth every 6 (six) hours as needed for cough. 05/13/17   Hinda Kehr, MD  ibuprofen (ADVIL,MOTRIN) 800 MG tablet Take 1 tablet (800 mg total) by mouth every 8 (eight) hours as needed. 03/06/16   Lisa Roca, MD  insulin detemir (LEVEMIR) 100 UNIT/ML injection Inject 60 Units into the skin at bedtime.    [provider]  omeprazole (PRILOSEC) 40 MG capsule Take 40 mg by mouth daily.    [provider]  prochlorperazine (COMPAZINE) 10 MG tablet Take 1 tablet (10 mg total) by mouth every 6 (six) hours as needed for nausea. 11/28/16   Paulette Blanch, MD    Allergies Patient has no known allergies.  No family history on file.  Social History Social History   Tobacco Use  . Smoking status: Never Smoker  . Smokeless tobacco: Never Used  Substance Use Topics  . Alcohol use: No  . Drug use: No    Review of Systems Constitutional: Subjective fever/chills.  Malaise, fatigue, myalgias. Eyes: No visual changes. ENT: Severely sore throat. Cardiovascular: Denies chest pain. Respiratory: Frequent cough. Denies shortness of breath. Gastrointestinal: No abdominal pain.  No nausea, no vomiting.  No diarrhea.  No constipation. Genitourinary: Negative for dysuria. Musculoskeletal: Negative for neck pain.  Negative for back pain. Integumentary: Negative for rash. Neurological: Generalized headache.  No focal weakness/numbness   ____________________________________________   PHYSICAL EXAM:  VITAL SIGNS: ED Triage Vitals  Enc Vitals Group  BP 05/13/17 0319 138/80     Pulse Rate 05/13/17 0319 81     Resp 05/13/17 0319 18     Temp 05/13/17 0319 98.4 F (36.9 C)     Temp Source 05/13/17 0319 Oral     SpO2 05/13/17 0319 97 %     Weight 05/13/17 0326 85 kg (187 lb 6.3 oz)     Height --      Head Circumference --      Peak Flow --      Pain Score 05/13/17 0320 9     Pain Loc --      Pain Edu? --      Excl. in [place]? --      Constitutional: Alert and oriented. Non-toxic but does appear ill with upper respiratory symptoms Eyes: Conjunctivae are normal.  Head: Atraumatic. Ears:  Healthy appearing ear canals and TMs bilaterally Nose: +congestion/rhinnorhea. Mouth/Throat: Mucous membranes are moist.  Oropharynx non-erythematous with no exudate nor petechiae. Neck: No stridor.  No meningeal signs.   Cardiovascular: Normal rate, regular rhythm. Good peripheral circulation. Grossly normal heart sounds. Respiratory: Normal respiratory effort.  No retractions. Lungs CTAB. Gastrointestinal: Soft and nontender. No distention.  Musculoskeletal: No lower extremity tenderness nor edema. No gross deformities of extremities. Neurologic:  Normal speech and language. No gross focal neurologic deficits are appreciated.  Skin:  Skin is warm, dry and intact. No rash noted. Psychiatric: Mood and affect are normal. Speech and behavior are normal.  ____________________________________________   LABS (all labs ordered are listed, but only abnormal results are displayed)  Labs Reviewed  GROUP A STREP BY PCR  INFLUENZA PANEL BY PCR (TYPE A & B)   ____________________________________________  EKG  None - EKG not ordered by ED physician ____________________________________________  RADIOLOGY   ED MD interpretation:  No pneumonia  Official radiology report(s): Dg Chest 2 View  Result Date: 05/13/2017 CLINICAL DATA:  Productive cough with chest pain EXAM: CHEST  2 VIEW COMPARISON:  06/24/2015 FINDINGS: Cardiac shadow is stable. The lungs are well aerated bilaterally with mild peribronchial changes bilaterally. No focal confluent infiltrate or sizable effusion is seen. No bony abnormality is noted. IMPRESSION: Increased peribronchial changes likely related to bronchitis. Electronically Signed   By: Inez Catalina M.D.   On: 05/13/2017 07:04    ____________________________________________   PROCEDURES  Critical Care  performed: No   Procedure(s) performed:   Procedures   ____________________________________________   INITIAL IMPRESSION / ASSESSMENT AND PLAN / ED COURSE  As part of my medical decision making, I reviewed the following data within the [place] notes reviewed and incorporated and Labs reviewed     Differential diagnosis includes, but is not limited to, influenza, other influenza-like respiratory infection, community-acquired pneumonia.  Less likely would be peritonsillar abscess, retropharyngeal abscess, epiglottitis.  Her voice is not hoarse but she is talking softly apparently due to the pain.  Her physical exam is reassuring with a surprisingly normal-appearing pharynx and no evidence of any abscess.  She has not tender to palpation of the neck and she has no stridor.  I doubt epiglottitis and based on her constellation of symptoms I strongly suspect influenza or influenza-like illness.  She does have a frequent cough as well so I will check a two-view chest x-ray as well as influenza tests.  I am giving her a dose of Decadron 10 mg intramuscular to help with the severely sore throat.  Clinical Course as of May 14 735  Tue May 13, 2017  0719 GI cocktail to help numb the patient's throat  [CF]  0720 No indication of pneumonia on chest x-ray.  Will provide usual/customary management recommendations, prescribe cough syrup, recommendations for hydration.   I gave my usual and customary return precautions.   DG Chest 2 View [CF]  [phone] Influenza was negative.  I will inform the patient.  [CF]    Clinical Course User Index [CF] Hinda Kehr, MD    ____________________________________________  FINAL CLINICAL IMPRESSION(S) / ED DIAGNOSES  Final diagnoses:  Acute pharyngitis, unspecified etiology  Viral URI with cough     MEDICATIONS GIVEN DURING THIS VISIT:  Medications  gi cocktail (Maalox,Lidocaine,Donnatal) (not administered)  dexamethasone  (DECADRON) injection 10 mg (10 mg Intramuscular Given 05/13/17 0647)     ED Discharge Orders        Ordered    HYDROcodone-homatropine (HYCODAN) 5-1.5 MG/5ML syrup  Every 6 hours PRN     05/13/17 0736       Note:  This document was prepared using Dragon voice recognition software and may include unintentional dictation errors.    Hinda Kehr, MD 05/13/17 [phone]

## 2017-05-13 NOTE — ED Notes (Signed)
Patient is having body aches all over

## 2018-01-08 IMAGING — CT CT RENAL STONE PROTOCOL
1 of 2 series · 15 of 32 positions shown, 19 images · non-contrast
Comparison: 12/19/2014

CLINICAL DATA: LEFT upper quadrant pain radiating to LEFT flank for
2 days with nausea, vomiting and diarrhea, history of kidney stones,
cholecystectomy, diabetes mellitus

EXAM:
CT ABDOMEN AND PELVIS WITHOUT CONTRAST
TECHNIQUE: Multidetector CT imaging of the abdomen and pelvis was performed
following the standard protocol without IV contrast.

[Series 2: stone standard full · axial · 0.91mm/px · z∈[-628,-168]mm · 15 of 100 slices shown, 19 images]
[im 4/100  soft-tissue]
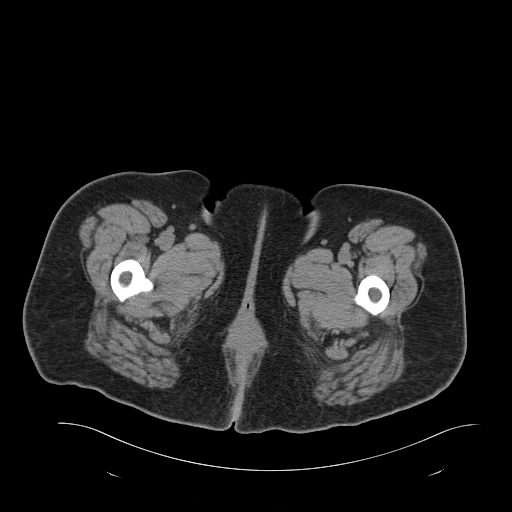
[im 4/100  bone]
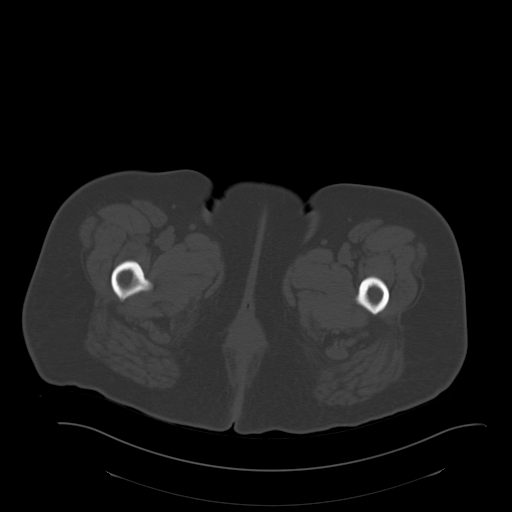
[im 12/100  soft-tissue]
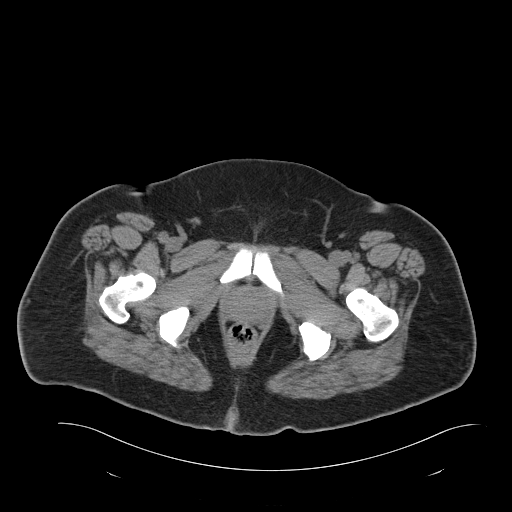
[im 20/100  soft-tissue]
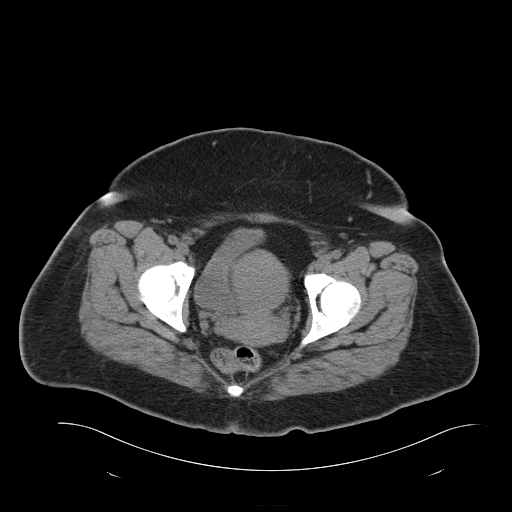
[im 28/100  soft-tissue]
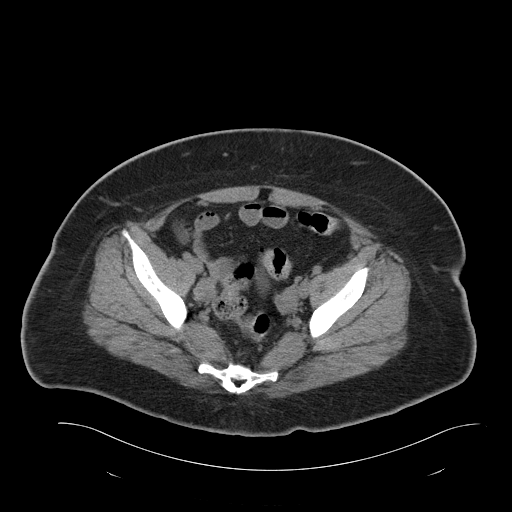
[im 36/100  soft-tissue]
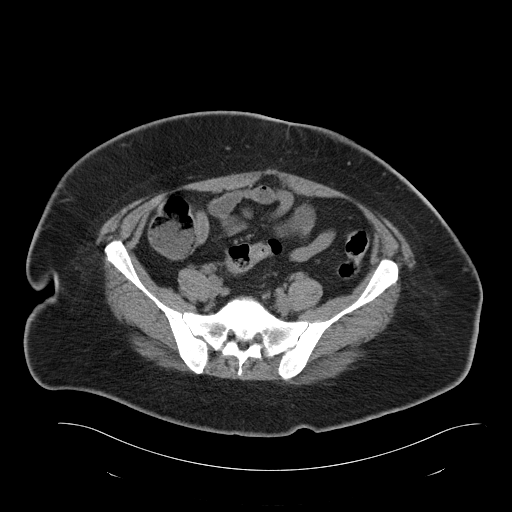
[im 44/100  soft-tissue]
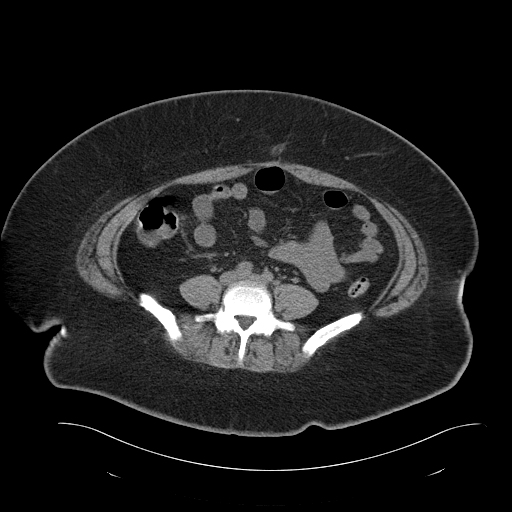
[im 52/100  soft-tissue]
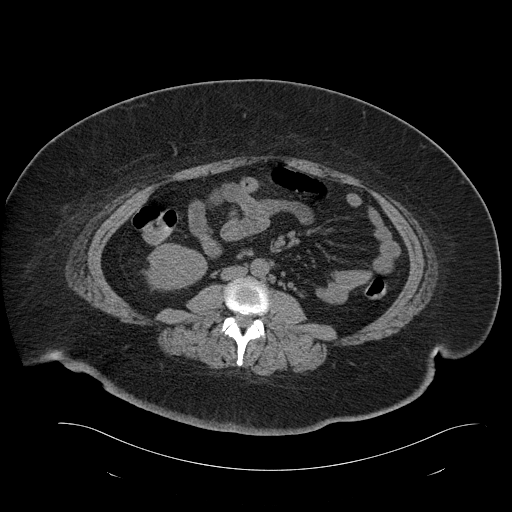
[im 56/100  soft-tissue]
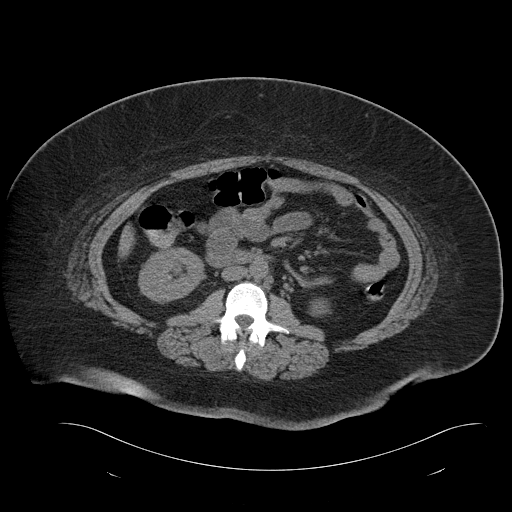
[im 64/100  soft-tissue]
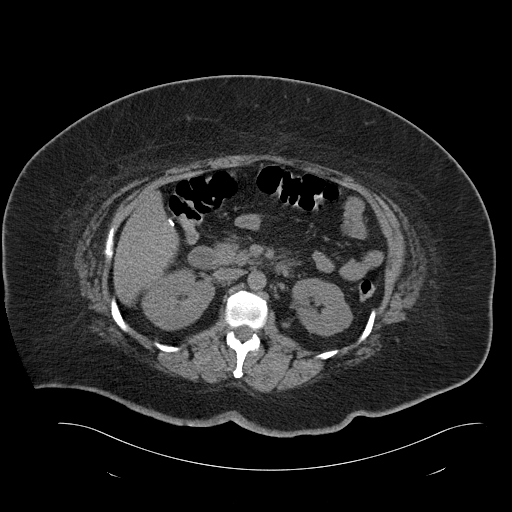
[im 64/100  bone]
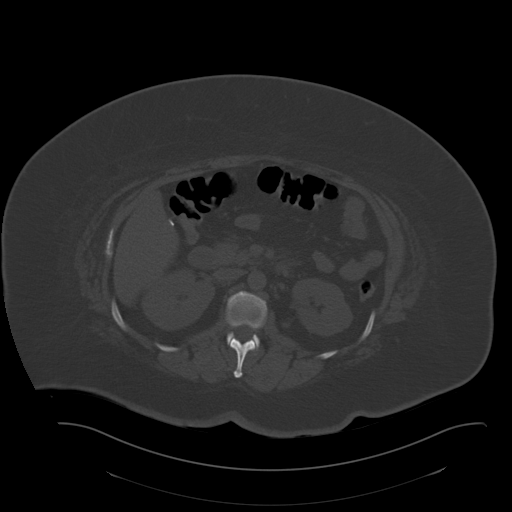
[im 72/100  soft-tissue]
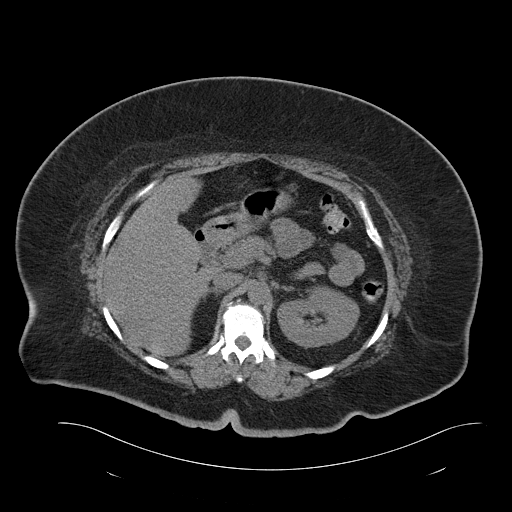
[im 80/100  soft-tissue]
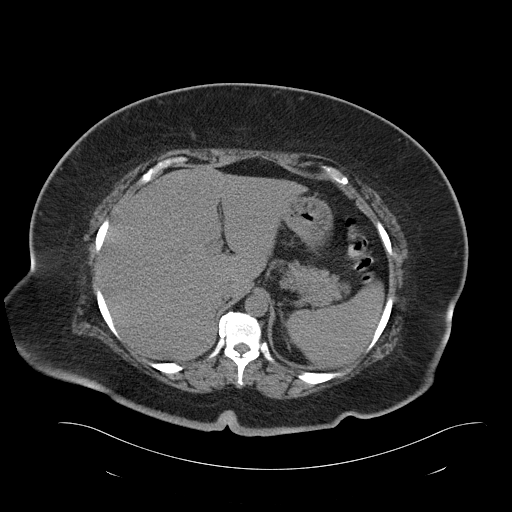
[im 84/100  lung]
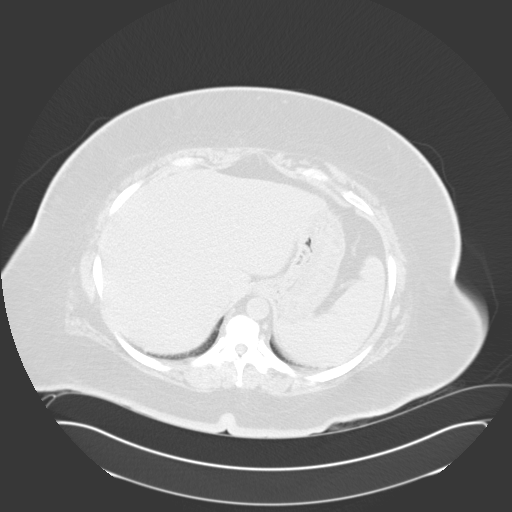
[im 88/100  soft-tissue]
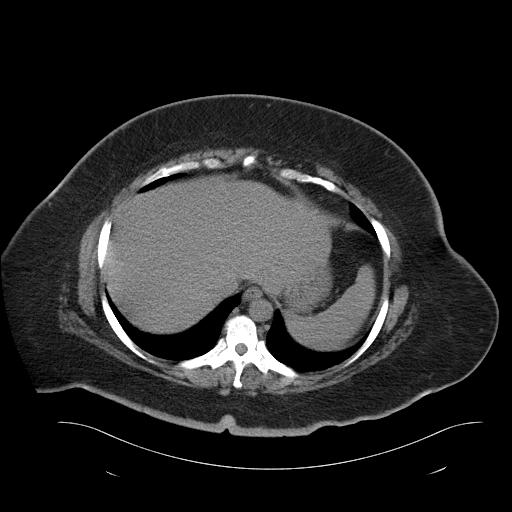
[im 88/100  lung]
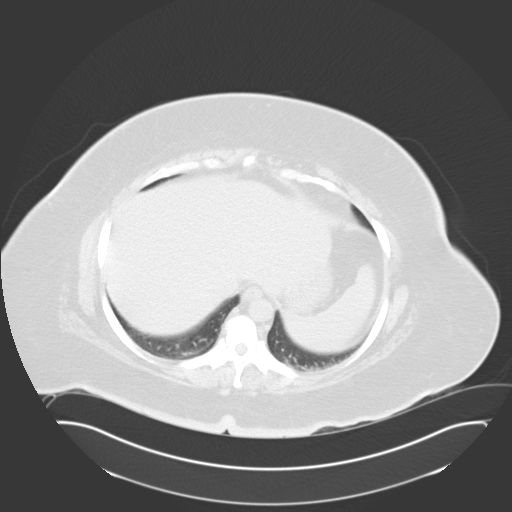
[im 92/100  lung]
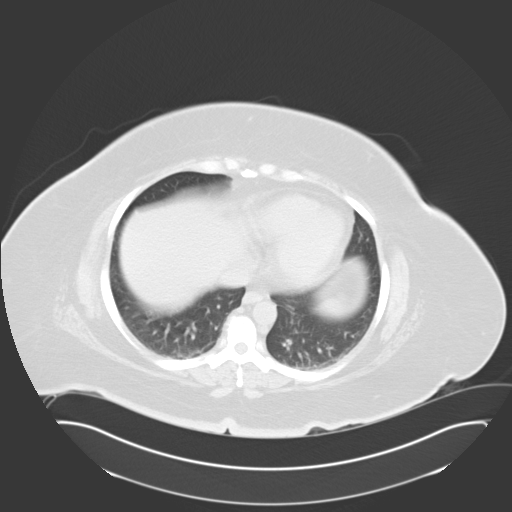
[im 96/100  soft-tissue]
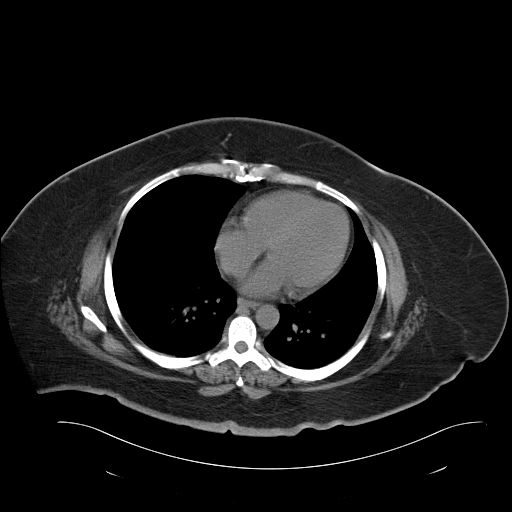
[im 96/100  lung]
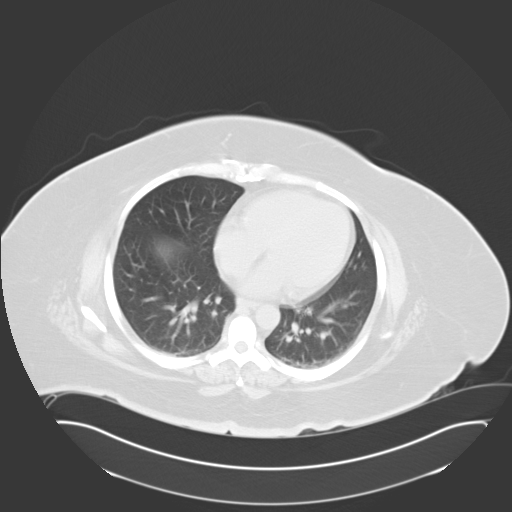

[15 of 32 positions shown; findings below may reference images not displayed]

FINDINGS: Minimal dependent bibasilar atelectasis in lower lobes.

Gallbladder surgically absent.

Tiny nonobstructing calculus at inferior pole LEFT kidney coronal
image 106.

Within limits of a nonenhanced exam, liver, spleen, pancreas,
kidneys, and adrenal glands otherwise normal.

Minimal descending and sigmoid colonic diverticulosis without
evidence of diverticulitis.

Stomach and bowel loops otherwise normal.

Unremarkable bladder, ureters, uterus and adnexa.

Normal appendix.

No mass, adenopathy, free air, free fluid or inflammatory process.

No acute osseous findings.
IMPRESSION: Tiny nonobstructing LEFT renal calculus at inferior pole.

Minimal distal colonic diverticulosis without evidence of
diverticulitis.

Otherwise negative exam with no acute intra-abdominal or intra
pelvic process identified.

## 2018-02-16 IMAGING — CR DG CHEST 2V
1 series · 2 of 2 positions shown · non-contrast
Comparison: 02/21/2014

CLINICAL DATA: Flu-like symptoms, duration 9 days.

EXAM:
CHEST  2 VIEW

[Series 1: dg chest 2 view · 0.14mm/px · 2 of 2 slices shown]
[im 1/2]
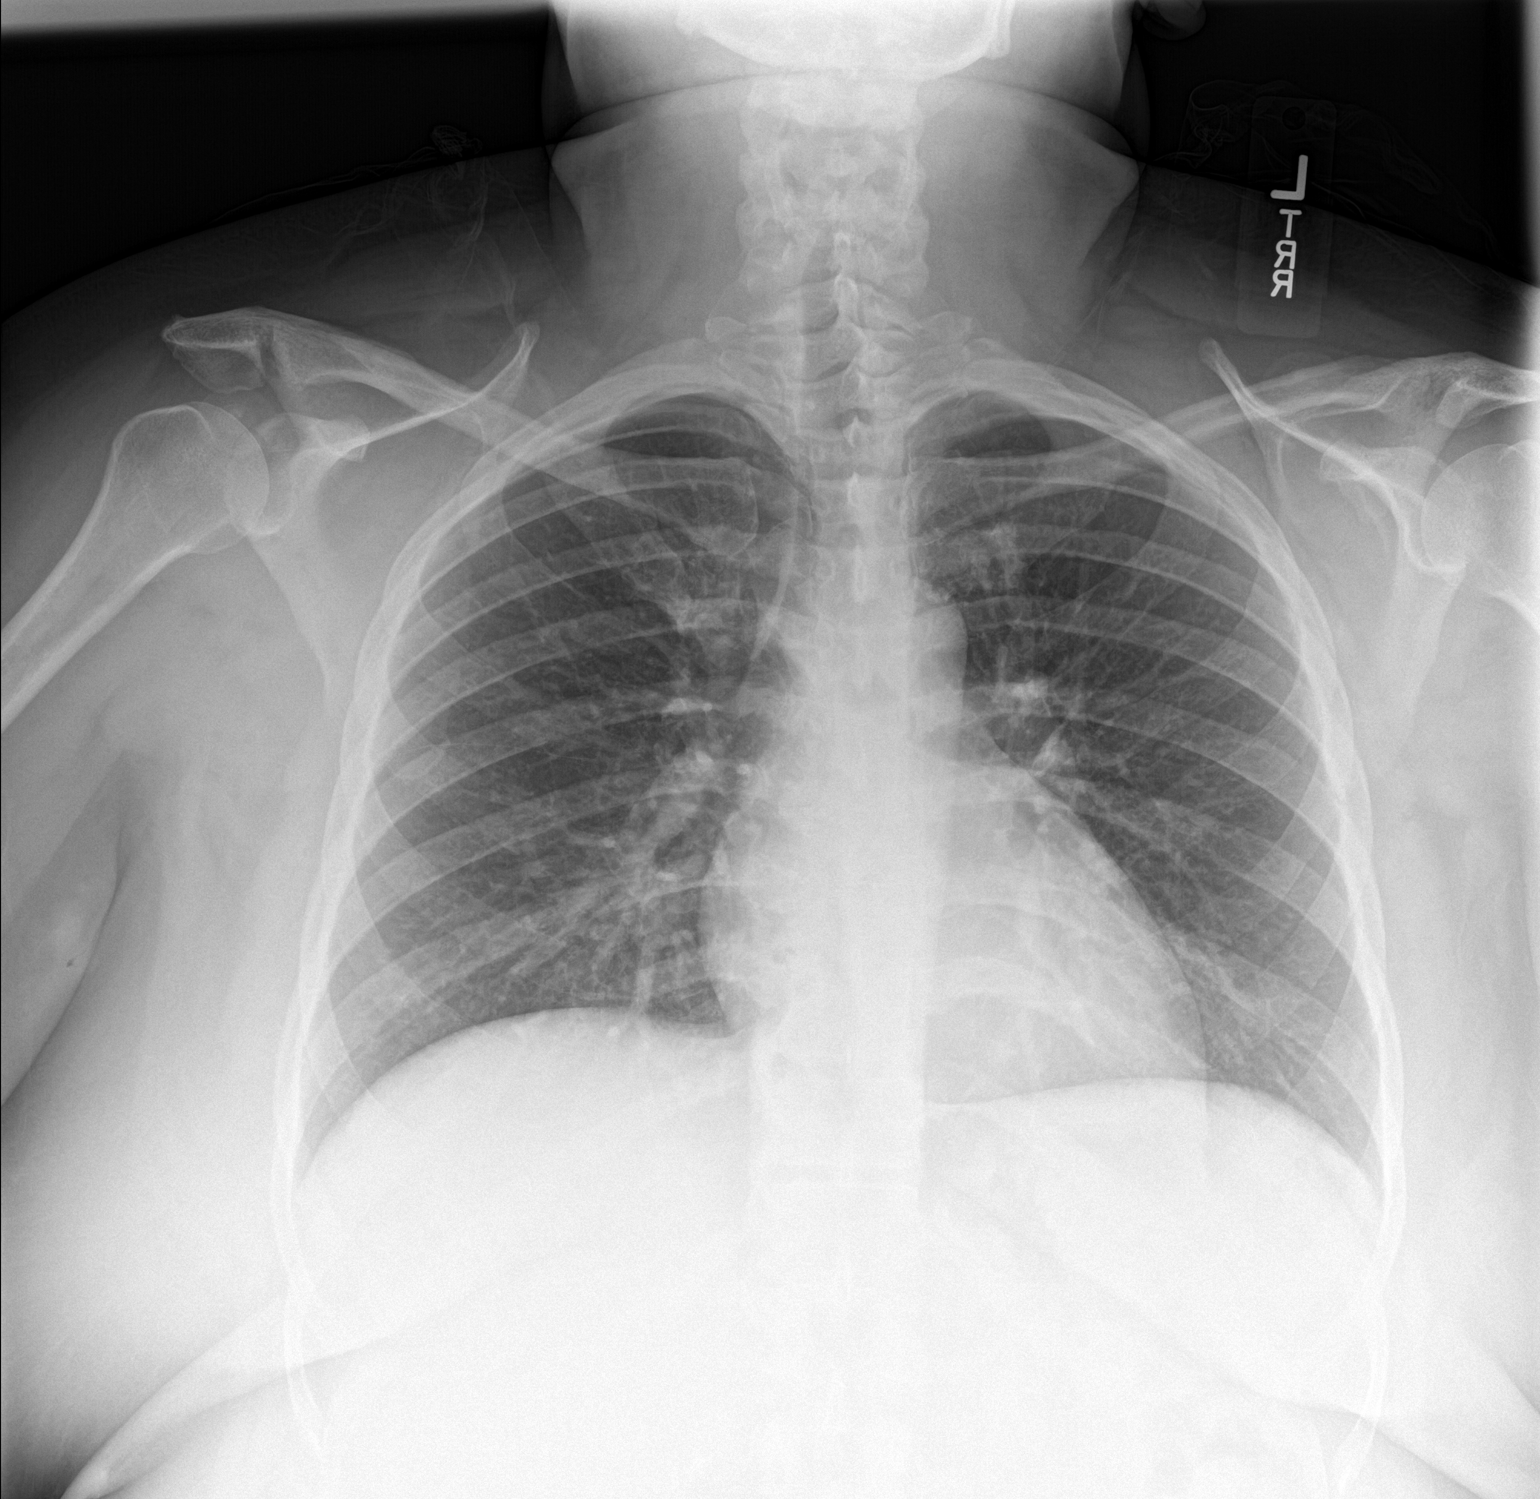
[im 2/2]
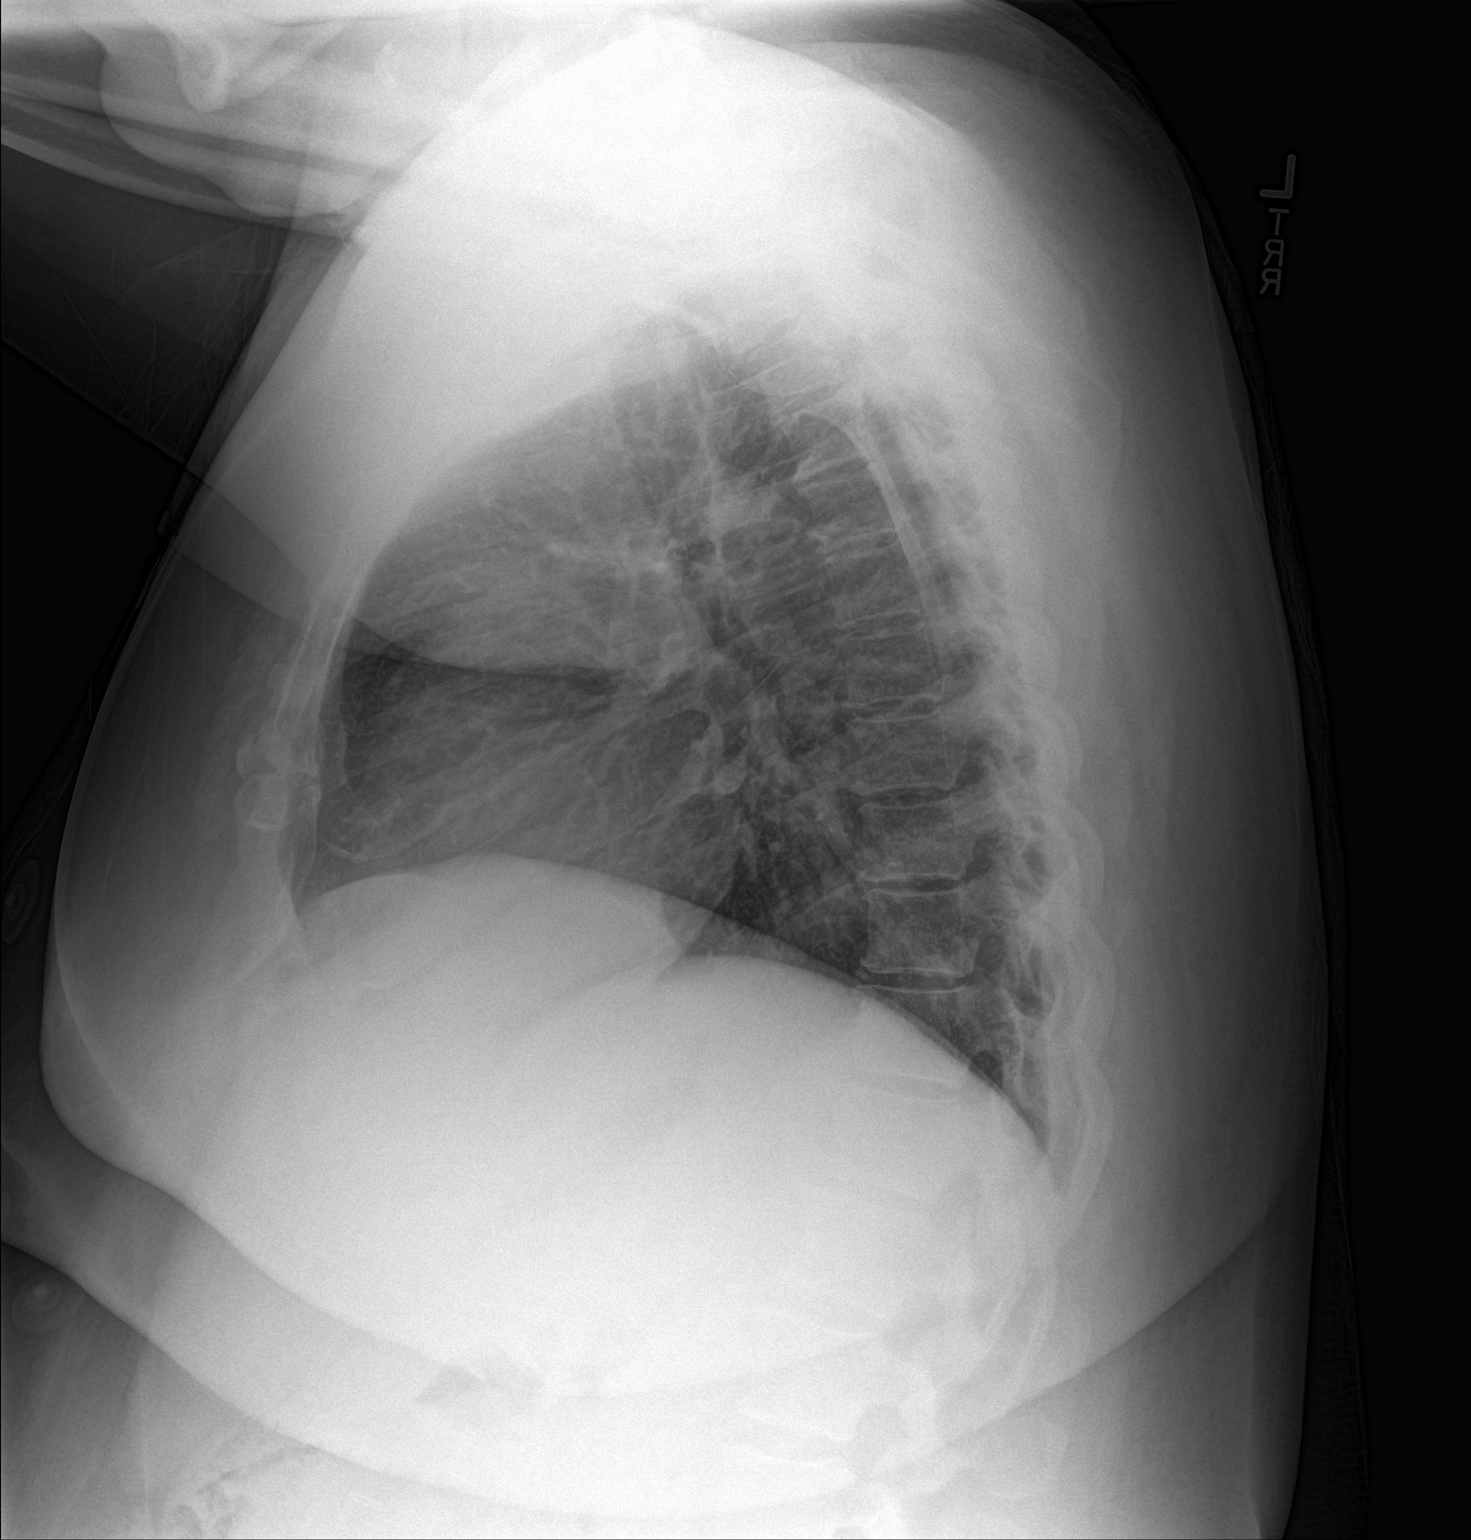

[2 of 2 positions shown; findings below may reference images not displayed]

FINDINGS: The heart size and mediastinal contours are within normal limits.
Both lungs are clear. The visualized skeletal structures are
unremarkable.
IMPRESSION: No active cardiopulmonary disease.

## 2018-04-12 ENCOUNTER — Other Ambulatory Visit: Payer: Self-pay

## 2018-04-12 ENCOUNTER — Emergency Department
Admission: EM | Admit: 2018-04-12 | Discharge: 2018-04-12 | Disposition: A | Discharge status: Home / Self Care | Payer: Self-pay | Attending: Emergency Medicine | Admitting: Emergency Medicine

## 2018-04-12 ENCOUNTER — Emergency Department: Payer: Self-pay

## 2018-04-12 ENCOUNTER — Encounter: Payer: Self-pay | Admitting: Emergency Medicine

## 2018-04-12 DIAGNOSIS — Z794 Long term (current) use of insulin: Secondary | ICD-10-CM | POA: Insufficient documentation

## 2018-04-12 DIAGNOSIS — M79602 Pain in left arm: Secondary | ICD-10-CM | POA: Insufficient documentation

## 2018-04-12 DIAGNOSIS — E119 Type 2 diabetes mellitus without complications: Secondary | ICD-10-CM | POA: Insufficient documentation

## 2018-04-12 DIAGNOSIS — M792 Neuralgia and neuritis, unspecified: Secondary | ICD-10-CM

## 2018-04-12 DIAGNOSIS — Z79899 Other long term (current) drug therapy: Secondary | ICD-10-CM | POA: Insufficient documentation

## 2018-04-12 LAB — CBC
HEMATOCRIT: 33.2 % — AB (ref 36.0–46.0)
HEMOGLOBIN: 10.6 g/dL — AB (ref 12.0–15.0)
MCH: 26.1 pg (ref 26.0–34.0)
MCHC: 31.9 g/dL (ref 30.0–36.0)
MCV: 81.8 fL (ref 80.0–100.0)
Platelets: 262 10*3/uL (ref 150–400)
RBC: 4.06 MIL/uL (ref 3.87–5.11)
RDW: 15.9 % — AB (ref 11.5–15.5)
WBC: 8.7 10*3/uL (ref 4.0–10.5)
nRBC: 0 % (ref 0.0–0.2)

## 2018-04-12 LAB — BASIC METABOLIC PANEL
Anion gap: 7 (ref 5–15)
BUN: 20 mg/dL (ref 6–20)
CHLORIDE: 108 mmol/L (ref 98–111)
CO2: 22 mmol/L (ref 22–32)
CREATININE: 0.48 mg/dL (ref 0.44–1.00)
Calcium: 8.8 mg/dL — ABNORMAL LOW (ref 8.9–10.3)
GFR calc Af Amer: >60 mL/min (ref >60)
GFR calc non Af Amer: >60 mL/min (ref >60)
GLUCOSE: 123 mg/dL — AB (ref 70–99)
POTASSIUM: 3.9 mmol/L (ref 3.5–5.1)
Sodium: 137 mmol/L (ref 135–145)

## 2018-04-12 LAB — TROPONIN I: Troponin I: <0.03 ng/mL (ref <0.03)

## 2018-04-12 MED ORDER — HYDROCODONE-ACETAMINOPHEN 5-325 MG PO TABS: 1.0000 | ORAL_TABLET | Freq: Four times a day (QID) | ORAL | 0 refills | Status: DC | PRN

## 2018-04-12 MED ORDER — HYDROCODONE-ACETAMINOPHEN 5-325 MG PO TABS
1.0000 | ORAL_TABLET | Freq: Once | ORAL | Status: AC
Start: 2018-04-12 — End: 2018-04-12
  Administered 2018-04-12: 1 via ORAL
  Filled 2018-04-12: qty 1

## 2018-04-12 MED ORDER — HYDROCODONE-ACETAMINOPHEN 5-325 MG PO TABS
1.0000 | ORAL_TABLET | Freq: Once | ORAL | Status: AC
  Administered 2018-04-12: 1 via ORAL
  Filled 2018-04-12: qty 1

## 2018-04-12 NOTE — ED Provider Notes (Signed)
[facility] Emergency Department Provider Note   ____________________________________________   First MD Initiated Contact with Patient 04/12/18 1608     (approximate)  I have reviewed the triage vital signs and the nursing notes.   HISTORY  Chief Complaint Shoulder Pain and Chest Pain  Spanish interpreter Barnie Alderman utilized throughout  HPI Tina Blackburn is a 48 y.o. female here for evaluation of left-sided neck pain  Patient reports that she has had 3 months of pain involving the left side of her neck shooting the left hand.   Sharp pain, worse when she tries to lift her hand.  Saw her primary care doctor and they advised her on some muscular activities to do, but reports that has not helped and it causes pain when she lifts her left shoulder up or turns the neck to the side.  Denies "chest pain".  No nausea or vomiting.  No shortness of breath.  No abdominal pain.  Denies weakness in the arm except if she lifts it above the shoulder it starts to hurt or if she turns her neck it hurts quite a bit on the left side of her neck  This pain is been present for 3 months but seems to be a little worse over the last 3 days  Past Medical History:  Diagnosis Date  . Brain tumor (benign) ([place])   . Diabetes mellitus without complication ([place])     There are no active problems to display for this patient.   Past Surgical History:  Procedure Laterality Date  . CHOLECYSTECTOMY      Prior to Admission medications   Medication Sig Start Date End Date Taking? Authorizing Provider  atorvastatin (LIPITOR) 20 MG tablet Take 20 mg by mouth daily.    [provider]  cetirizine (ZYRTEC) 10 MG tablet Take 10 mg by mouth.    [provider]  HYDROcodone-homatropine (HYCODAN) 5-1.5 MG/5ML syrup Take 5 mLs by mouth every 6 (six) hours as needed for cough. 05/13/17   Hinda Kehr, MD  ibuprofen (ADVIL,MOTRIN) 800 MG tablet Take 1 tablet (800 mg  total) by mouth every 8 (eight) hours as needed. 03/06/16   Lisa Roca, MD  insulin detemir (LEVEMIR) 100 UNIT/ML injection Inject 60 Units into the skin at bedtime.    [provider]  omeprazole (PRILOSEC) 40 MG capsule Take 40 mg by mouth daily.    [provider]  prochlorperazine (COMPAZINE) 10 MG tablet Take 1 tablet (10 mg total) by mouth every 6 (six) hours as needed for nausea. 11/28/16   Paulette Blanch, MD    Allergies Patient has no known allergies.  No family history on file.  Social History Social History   Tobacco Use  . Smoking status: Never Smoker  . Smokeless tobacco: Never Used  Substance Use Topics  . Alcohol use: No  . Drug use: No    Review of Systems Constitutional: No fever/chills Eyes: No visual changes. ENT: No sore throat.  See HPI Cardiovascular: Denies chest pain. Respiratory: Denies shortness of breath. Gastrointestinal: No abdominal pain.   Genitourinary: Negative for dysuria. Musculoskeletal: Negative for back pain.  See HPI Skin: Negative for rash. Neurological: Negative for headaches, areas of focal weakness or numbness.    ____________________________________________   PHYSICAL EXAM:  VITAL SIGNS: ED Triage Vitals  Enc Vitals Group     BP 04/12/18 1321 107/67     Pulse Rate 04/12/18 1321 79     Resp 04/12/18 1321 16  Temp 04/12/18 1321 98.4 F (36.9 C)     Temp Source 04/12/18 1321 Oral     SpO2 04/12/18 1321 98 %     Weight --      Height --      Head Circumference --      Peak Flow --      Pain Score 04/12/18 1318 9     Pain Loc --      Pain Edu? --      Excl. in [place]? --     Constitutional: Alert and oriented. Well appearing and appears in pain, especially when she attempts to move her left arm into a abducting direction she reports pain rating from her neck down to the arm. Eyes: Conjunctivae are normal. Head: Atraumatic. Nose: No congestion/rhinnorhea. Mouth/Throat: Mucous membranes are  moist. Neck: No stridor.  Cardiovascular: Normal rate, regular rhythm. Grossly normal heart sounds.  Good peripheral circulation. Respiratory: Normal respiratory effort.  No retractions. Lungs CTAB. Gastrointestinal: Soft and nontender. No distention. Musculoskeletal:   RIGHT Right upper extremity demonstrates normal strength, good use of all muscles. No edema bruising or contusions of the right shoulder/upper arm, right elbow, right forearm / hand. Full range of motion of the right right upper extremity without pain. No evidence of trauma. Strong radial pulse. Intact median/ulnar/radial neuro-muscular exam.  LEFT Left upper extremity demonstrates normal strength, good use of all muscles. No edema bruising or contusions of the left shoulder/upper arm, left elbow, left forearm / hand.  Full range of motion except for limitation with abduction at about 90 degrees, additionally movement of the left arm on twisting turning of the neck seems to cause the pain to increase and is shooting fashion down her left arm. No evidence of trauma. Strong radial pulse. Intact median/ulnar/radial neuro-muscular exam.  Able to ambulate without difficulty.  Neurologic:  Normal speech and language. No gross focal neurologic deficits are appreciated.  Skin:  Skin is warm, dry and intact. No rash noted. Psychiatric: Mood and affect are normal. Speech and behavior are normal.  ____________________________________________   LABS (all labs ordered are listed, but only abnormal results are displayed)  Labs Reviewed  BASIC METABOLIC PANEL - Abnormal; Notable for the following components:      Result Value   Glucose, Bld 123 (*)    Calcium 8.8 (*)    All other components within normal limits  CBC - Abnormal; Notable for the following components:   Hemoglobin 10.6 (*)    HCT 33.2 (*)    RDW 15.9 (*)    All other components within normal limits  TROPONIN I    ____________________________________________  EKG  ED ECG REPORT I, Delman Kitten, the attending physician, personally viewed and interpreted this ECG.  Date: 04/12/2018 EKG Time: 1320 Rate: 80 Rhythm: normal sinus rhythm QRS Axis: normal Intervals: normal ST/T Wave abnormalities: normal except for a slight biphasic appearance of V2 in isolation. Narrative Interpretation: no evidence of acute ischemia  ____________________________________________  RADIOLOGY  Dg Chest 2 View  Result Date: 04/12/2018 CLINICAL DATA:  48 y/o  F; weeks of left shoulder pain. EXAM: CHEST - 2 VIEW COMPARISON:  05/13/2017 chest radiograph FINDINGS: Stable heart size and mediastinal contours are within normal limits. Both lungs are clear. The visualized skeletal structures are unremarkable. Right upper quadrant cholecystectomy surgical clips noted. IMPRESSION: No acute pulmonary process identified. Electronically Signed   By: Kristine Garbe M.D.   On: 04/12/2018 14:10   Dg Cervical Spine Complete  Result Date: 04/12/2018  CLINICAL DATA:  LEFT neck pain radiating to shoulder and LEFT hand for 3 months worse today, no known injury EXAM: CERVICAL SPINE - COMPLETE 4+ VIEW COMPARISON:  None FINDINGS: Slight reversal of cervical lordosis question muscle spasm. Prevertebral soft tissues normal thickness. Osseous mineralization normal. Vertebral body and disc space heights maintained. Mild scattered anterior spur formation of vertebral endplates and minimal calcification of the anterior longitudinal ligament. No fracture, subluxation, or bone destruction. Bony foramina patent. Lung apices clear. IMPRESSION: Mild degenerative disc disease changes. No acute abnormalities. Electronically Signed   By: Lavonia Dana M.D.   On: 04/12/2018 17:07    Imaging reviewed, negative for acute findings.  Mild degenerative changes in the neck ____________________________________________   PROCEDURES  Procedure(s) performed:  None  Procedures  Critical Care performed: No  ____________________________________________   INITIAL IMPRESSION / ASSESSMENT AND PLAN / ED COURSE  Pertinent labs & imaging results that were available during my care of the patient were reviewed by me and considered in my medical decision making (see chart for details).   Patient presents, reports discomfort for 3 months now.  It appears to be radicular in nature and she reports it originates in the neck runs over the shoulder and the left hand.  Worsened by lifting her arm up and also with turning her neck.  No evidence of acute cardiopulmonary etiology.  Does not appear consistent with ACS.  Does not appear consistent with angina.  Clinical history seem to highly suggest musculoskeletal or radicular etiology, which I suspect radicular.  She has no neurologic deficits.  Strong pulses and normal cap refill and neuro exam of the left arm.  No evidence of shoulder injury and she denies any trauma.  She is tried over-the-counter medications without relief, she does appear in pain will treat with hydrocodone and reassess for at least short-term  Clinical Course as of Apr 12 1826  Sun Apr 12, 2018  1825 Reviewed results, plan for follow-up and reassessed with interpreter.  Patient reports pain is improved, she appears to be resting comfortably.  In no distress.  She is comfortable to plan to follow-up with neurosurgery Dr. Cari Caraway as I suspect this is some type of radicular pain from the neck.  No signs of acute coronary syndrome.  Patient denies chest pain.  Resting comfortably at this time   [MQ]  1826 I will prescribe the patient a narcotic pain medicine due to their condition which I anticipate will cause at least moderate pain short term. I discussed with the patient safe use of narcotic pain medicines, and that they are not to drive, work in dangerous areas, or ever take more than prescribed (no more than 1 pill every 6 hours). We discussed  that this is the type of medication that can be  overdosed on and the risks of this type of medicine. Patient is very agreeable to only use as prescribed and to never use more than prescribed.    [MQ]    Clinical Course User Index [MQ] Delman Kitten, MD     ____________________________________________   FINAL CLINICAL IMPRESSION(S) / ED DIAGNOSES  Final diagnoses:  None        Note:  This document was prepared using Dragon voice recognition software and may include unintentional dictation errors       Delman Kitten, MD 04/12/18 1955

## 2018-04-12 NOTE — ED Triage Notes (Signed)
Pt to ED via POV c/p left shoulder pain x 3 weeks. Pt reports that the pain got worse today. Upon arrival to ED pt c/o left sided upper back pain, left should and arm pain. Pt is diaphoretic. Pt states that she is nauseated but denies vomiting, pt denies shortness of breath. Pt is in NAD at this time

## 2018-10-29 ENCOUNTER — Other Ambulatory Visit: Payer: Self-pay

## 2018-10-29 ENCOUNTER — Emergency Department
Admission: EM | Admit: 2018-10-29 | Discharge: 2018-10-29 | Disposition: A | Discharge status: Home / Self Care | Payer: HRSA Program | Attending: Emergency Medicine | Admitting: Emergency Medicine

## 2018-10-29 ENCOUNTER — Emergency Department: Payer: HRSA Program

## 2018-10-29 DIAGNOSIS — Z794 Long term (current) use of insulin: Secondary | ICD-10-CM | POA: Insufficient documentation

## 2018-10-29 DIAGNOSIS — Z20822 Contact with and (suspected) exposure to covid-19: Secondary | ICD-10-CM

## 2018-10-29 DIAGNOSIS — E119 Type 2 diabetes mellitus without complications: Secondary | ICD-10-CM | POA: Diagnosis not present

## 2018-10-29 DIAGNOSIS — R0602 Shortness of breath: Secondary | ICD-10-CM | POA: Insufficient documentation

## 2018-10-29 DIAGNOSIS — Z79899 Other long term (current) drug therapy: Secondary | ICD-10-CM | POA: Diagnosis not present

## 2018-10-29 DIAGNOSIS — Z20828 Contact with and (suspected) exposure to other viral communicable diseases: Secondary | ICD-10-CM | POA: Insufficient documentation

## 2018-10-29 LAB — CBC WITH DIFFERENTIAL/PLATELET
Abs Immature Granulocytes: 0.05 10*3/uL (ref 0.00–0.07)
Basophils Absolute: 0 10*3/uL (ref 0.0–0.1)
Basophils Relative: 0 %
Eosinophils Absolute: 0.1 10*3/uL (ref 0.0–0.5)
Eosinophils Relative: 1 %
HCT: 37.6 % (ref 36.0–46.0)
Hemoglobin: 12.6 g/dL (ref 12.0–15.0)
Immature Granulocytes: 1 %
Lymphocytes Relative: 32 %
Lymphs Abs: 2.4 10*3/uL (ref 0.7–4.0)
MCH: 29 pg (ref 26.0–34.0)
MCHC: 33.5 g/dL (ref 30.0–36.0)
MCV: 86.4 fL (ref 80.0–100.0)
Monocytes Absolute: 0.7 10*3/uL (ref 0.1–1.0)
Monocytes Relative: 10 %
Neutro Abs: 4.2 10*3/uL (ref 1.7–7.7)
Neutrophils Relative %: 56 %
Platelets: 245 10*3/uL (ref 150–400)
RBC: 4.35 MIL/uL (ref 3.87–5.11)
RDW: 13.2 % (ref 11.5–15.5)
WBC: 7.3 10*3/uL (ref 4.0–10.5)
nRBC: 0 % (ref 0.0–0.2)

## 2018-10-29 LAB — COMPREHENSIVE METABOLIC PANEL
ALT: 23 U/L (ref 0–44)
AST: 20 U/L (ref 15–41)
Albumin: 3.9 g/dL (ref 3.5–5.0)
Alkaline Phosphatase: 56 U/L (ref 38–126)
Anion gap: 5 (ref 5–15)
BUN: 16 mg/dL (ref 6–20)
CO2: 22 mmol/L (ref 22–32)
Calcium: 8.8 mg/dL — ABNORMAL LOW (ref 8.9–10.3)
Chloride: 111 mmol/L (ref 98–111)
Creatinine, Ser: 0.63 mg/dL (ref 0.44–1.00)
GFR calc Af Amer: >60 mL/min (ref >60)
GFR calc non Af Amer: >60 mL/min (ref >60)
Glucose, Bld: 144 mg/dL — ABNORMAL HIGH (ref 70–99)
Potassium: 3.7 mmol/L (ref 3.5–5.1)
Sodium: 138 mmol/L (ref 135–145)
Total Bilirubin: 0.3 mg/dL (ref 0.3–1.2)
Total Protein: 7.4 g/dL (ref 6.5–8.1)

## 2018-10-29 LAB — TROPONIN I (HIGH SENSITIVITY): Troponin I (High Sensitivity): <2 ng/L (ref <18)

## 2018-10-29 NOTE — Discharge Instructions (Signed)
QUARANTINE INSTRUCTION  Follow these instructions at home:  Protecting others To avoid spreading the illness to other people: Quarantine in your home until you have had no cough and fever for 7 days. Household members should also be quarantine for at least 14 days after being exposed to you. Wash your hands often with soap and water. If soap and water are not available, use an alcohol-based hand sanitizer. If you have not cleaned your hands, do not touch your face. Make sure that all people in your household wash their hands well and often. Cover your nose and mouth when you cough or sneeze. Throw away used tissues. Stay home if you have any cold-like or flu-like symptoms. General instructions Go to your local pharmacy and buy a pulse oximeter (this is a machine that measures your oxygen). Check your oxygen levels at least 3 times a day. If your oxygen level is 92% or less return to the emergency room immediately Take over-the-counter and prescription medicines only as told by your health care provider. If you need medication for fever take tylenol or ibuprofen Drink enough fluid to keep your urine pale yellow. Rest at home as directed by your health care provider. Do not give aspirin to a child with the flu, because of the association with Reye's syndrome. Do not use tobacco products, including cigarettes, chewing tobacco, and e-cigarettes. If you need help quitting, ask your health care provider. Keep all follow-up visits as told by your health care provider. This is important. How is this prevented? Avoid areas where an outbreak has been reported. Avoid large groups of people. Keep a safe distance from people who are coughing and sneezing. Do not touch your face if you have not cleaned your hands. When you are around people who are sick or might be sick, wear a mask to protect yourself. Contact a health care provider if: You have symptoms of SARS (cough, fever, chest pain, shortness of  breath) that are not getting better at home. You have a fever. If you have difficulty breathing go to your local ER or call 911

## 2018-10-29 NOTE — ED Triage Notes (Signed)
Patient ambulatory to triage with steady gait, without difficulty or distress noted, mask in place; +COVID household member; reports since yesterday having nausea and SHOB; denies cough or congestion

## 2018-10-29 NOTE — ED Provider Notes (Signed)
[facility] Emergency Department Provider Note  ____________________________________________  Time seen: Approximately 5:14 AM  I have reviewed the triage vital signs and the nursing notes.   HISTORY  Chief Complaint Shortness of Breath   HPI Tina Blackburn is a 48 y.o. female with a history of meningioma and diabetes type 2 insulin-dependent who presents for evaluation of COVID like symptoms.  Patient has had symptoms for 3 weeks.  Her husband has had similar symptoms and tested positive for COVID-19.  She describes body aches, intermittent headache, intermittent sore throat, shortness of breath which is mild, intermittent, at rest and with exertion.  She has had burning sensation in the epigastric region radiating to center of her chest which has been intermittent associated with nausea.  No vomiting, no diarrhea, no cough.  Patient is not a smoker, has no chronic lung problems.  Past Medical History:  Diagnosis Date  . Brain tumor (benign) ([place])   . Diabetes mellitus without complication [facility])     Past Surgical History:  Procedure Laterality Date  . CHOLECYSTECTOMY      Prior to Admission medications   Medication Sig Start Date End Date Taking? Authorizing Provider  atorvastatin (LIPITOR) 20 MG tablet Take 20 mg by mouth daily.    [provider]  cetirizine (ZYRTEC) 10 MG tablet Take 10 mg by mouth.    [provider]  HYDROcodone-acetaminophen (NORCO/VICODIN) 5-325 MG tablet Take 1 tablet by mouth every 6 (six) hours as needed for moderate pain. 04/12/18   Delman Kitten, MD  HYDROcodone-homatropine (HYCODAN) 5-1.5 MG/5ML syrup Take 5 mLs by mouth every 6 (six) hours as needed for cough. 05/13/17   Hinda Kehr, MD  ibuprofen (ADVIL,MOTRIN) 800 MG tablet Take 1 tablet (800 mg total) by mouth every 8 (eight) hours as needed. 03/06/16   Lisa Roca, MD  insulin detemir (LEVEMIR) 100 UNIT/ML injection Inject 60 Units into the skin  at bedtime.    [provider]  omeprazole (PRILOSEC) 40 MG capsule Take 40 mg by mouth daily.    [provider]  prochlorperazine (COMPAZINE) 10 MG tablet Take 1 tablet (10 mg total) by mouth every 6 (six) hours as needed for nausea. 11/28/16   Paulette Blanch, MD    Allergies Patient has no known allergies.  No family history on file.  Social History Social History   Tobacco Use  . Smoking status: Never Smoker  . Smokeless tobacco: Never Used  Substance Use Topics  . Alcohol use: No  . Drug use: No    Review of Systems  Constitutional: Negative for fever. + body aches Eyes: Negative for visual changes. ENT: + sore throat. Neck: No neck pain  Cardiovascular: Negative for chest pain. Respiratory: + shortness of breath. Gastrointestinal: + epigastric abdominal pain and nausea. No vomiting or diarrhea. Genitourinary: Negative for dysuria. Musculoskeletal: Negative for back pain. Skin: Negative for rash. Neurological: Negative for weakness or numbness. + HA Psych: No SI or HI  ____________________________________________   PHYSICAL EXAM:  VITAL SIGNS: ED Triage Vitals  Enc Vitals Group     BP 10/29/18 0435 (!) 151/75     Pulse Rate 10/29/18 0435 92     Resp 10/29/18 0435 18     Temp 10/29/18 0435 98.7 F (37.1 C)     Temp Source 10/29/18 0435 Oral     SpO2 10/29/18 0435 100 %     Weight 10/29/18 0433 161 lb (73 kg)     Height --  Head Circumference --      Peak Flow --      Pain Score 10/29/18 0434 0     Pain Loc --      Pain Edu? --      Excl. in [place]? --     Constitutional: Alert and oriented. Well appearing and in no apparent distress. HEENT:      Head: Normocephalic and atraumatic.         Eyes: Conjunctivae are normal. Sclera is non-icteric.       Mouth/Throat: Mucous membranes are moist.       Neck: Supple with no signs of meningismus. Cardiovascular: Regular rate and rhythm. No murmurs, gallops, or rubs. 2+ symmetrical distal  pulses are present in all extremities. No JVD. Respiratory: Normal respiratory effort. Lungs are clear to auscultation bilaterally. No wheezes, crackles, or rhonchi.  Gastrointestinal: Soft, non tender, and non distended with positive bowel sounds. No rebound or guarding. Genitourinary: No CVA tenderness. Musculoskeletal: Nontender with normal range of motion in all extremities. No edema, cyanosis, or erythema of extremities. Neurologic: Normal speech and language. Face is symmetric. Moving all extremities. No gross focal neurologic deficits are appreciated. Skin: Skin is warm, dry and intact. No rash noted. Psychiatric: Mood and affect are normal. Speech and behavior are normal.  ____________________________________________   LABS (all labs ordered are listed, but only abnormal results are displayed)  Labs Reviewed  COMPREHENSIVE METABOLIC PANEL - Abnormal; Notable for the following components:      Result Value   Glucose, Bld 144 (*)    Calcium 8.8 (*)    All other components within normal limits  NOVEL CORONAVIRUS, NAA (HOSPITAL ORDER, SEND-OUT TO REF LAB)  CBC WITH DIFFERENTIAL/PLATELET  TROPONIN I (HIGH SENSITIVITY)   ____________________________________________  EKG   ED ECG REPORT I, Rudene Re, the attending physician, personally viewed and interpreted this ECG.  Normal sinus rhythm, rate of 82, normal intervals, normal axis, no ST elevations or depressions.  Normal EKG ____________________________________________  RADIOLOGY  I have personally reviewed the images performed during this visit and I agree with the Radiologist's read.   Interpretation by Radiologist:  Dg Chest Portable 1 View  Result Date: 10/29/2018 CLINICAL DATA:  Cough and shortness of breath EXAM: PORTABLE CHEST 1 VIEW COMPARISON:  04/12/2018 FINDINGS: Normal heart size and mediastinal contours. No acute infiltrate or edema. No effusion or pneumothorax. No acute osseous findings.  Cholecystectomy clips. IMPRESSION: Negative chest. Electronically Signed   By: Monte Fantasia M.D.   On: 10/29/2018 06:18      ____________________________________________   PROCEDURES  Procedure(s) performed: None Procedures Critical Care performed:  None ____________________________________________   INITIAL IMPRESSION / ASSESSMENT AND PLAN / ED COURSE  48 y.o. female with a history of meningioma and diabetes type 2 insulin-dependent who presents for evaluation of COVID like symptoms x 3 weeks. Husband + for COVID.  Patient is extremely well-appearing, no distress, normal vital signs, satting 96 to 100% at rest and with ambulation, lungs are clear to auscultation, normal work of breathing.  Will test patient for COVID.  Will get a chest x-ray to rule out bacterial pneumonia, will check basic labs.  Will get EKG and troponin to rule out COVID induced myocarditis.    _________________________ 6:31 AM on 10/29/2018 -----------------------------------------  Patient remains with normal work of breathing, no hypoxia.  EKG and troponin with no evidence of myocarditis or strain.  Chest x-ray with no evidence of pneumonia.  Labs are within normal limits with no signs  of sepsis, dehydration, electrolyte abnormalities.  Discussed quarantine with patient.  Discussed importance of buying a pulse oximeter and checking her pulse ox at least 3 times a day.  Recommend return to the emergency room if she develops chest pain or her pulse ox is 92% or less.  Otherwise supportive care follow-up with primary care doctor.    As part of my medical decision making, I reviewed the following data within the [place] notes reviewed and incorporated, Labs reviewed , EKG interpreted , Old chart reviewed, Radiograph reviewed , Notes from prior ED visits and [place] Controlled Substance Database   Patient was evaluated in Emergency Department today for the symptoms described in the history of  present illness. Patient was evaluated in the context of the global COVID-19 pandemic, which necessitated consideration that the patient might be at risk for infection with the SARS-CoV-2 virus that causes COVID-19. Institutional protocols and algorithms that pertain to the evaluation of patients at risk for COVID-19 are in a state of rapid change based on information released by regulatory bodies including the CDC and federal and state organizations. These policies and algorithms were followed during the patient's care in the ED.   ____________________________________________   FINAL CLINICAL IMPRESSION(S) / ED DIAGNOSES   Final diagnoses:  Suspected Covid-19 Virus Infection      NEW MEDICATIONS STARTED DURING THIS VISIT:  ED Discharge Orders    None       Note:  This document was prepared using Dragon voice recognition software and may include unintentional dictation errors.    Alfred Levins, [place], MD 10/29/18 [phone]

## 2018-10-30 LAB — NOVEL CORONAVIRUS, NAA (HOSP ORDER, SEND-OUT TO REF LAB; TAT 18-24 HRS): SARS-CoV-2, NAA: NOT DETECTED

## 2019-03-27 ENCOUNTER — Emergency Department: Payer: Self-pay

## 2019-03-27 ENCOUNTER — Other Ambulatory Visit: Payer: Self-pay

## 2019-03-27 ENCOUNTER — Emergency Department
Admission: EM | Admit: 2019-03-27 | Discharge: 2019-03-27 | Disposition: A | Discharge status: Home / Self Care | Payer: Self-pay | Attending: Student in an Organized Health Care Education/Training Program | Admitting: Student in an Organized Health Care Education/Training Program

## 2019-03-27 ENCOUNTER — Encounter: Payer: Self-pay | Admitting: Emergency Medicine

## 2019-03-27 DIAGNOSIS — E119 Type 2 diabetes mellitus without complications: Secondary | ICD-10-CM | POA: Insufficient documentation

## 2019-03-27 DIAGNOSIS — Z20828 Contact with and (suspected) exposure to other viral communicable diseases: Secondary | ICD-10-CM | POA: Insufficient documentation

## 2019-03-27 DIAGNOSIS — M7918 Myalgia, other site: Secondary | ICD-10-CM | POA: Insufficient documentation

## 2019-03-27 DIAGNOSIS — Z79899 Other long term (current) drug therapy: Secondary | ICD-10-CM | POA: Insufficient documentation

## 2019-03-27 DIAGNOSIS — Z20822 Contact with and (suspected) exposure to covid-19: Secondary | ICD-10-CM

## 2019-03-27 DIAGNOSIS — Z794 Long term (current) use of insulin: Secondary | ICD-10-CM | POA: Insufficient documentation

## 2019-03-27 DIAGNOSIS — R0981 Nasal congestion: Secondary | ICD-10-CM | POA: Insufficient documentation

## 2019-03-27 LAB — INFLUENZA PANEL BY PCR (TYPE A & B)
Influenza A By PCR: NEGATIVE
Influenza B By PCR: NEGATIVE

## 2019-03-27 LAB — SARS CORONAVIRUS 2 (TAT 6-24 HRS): SARS Coronavirus 2: NEGATIVE

## 2019-03-27 NOTE — ED Notes (Signed)
Pt states she has been having nausea and body aches x3 days with a head ache. Pt denies and diarrhea or emesis.

## 2019-03-27 NOTE — ED Provider Notes (Signed)
[facility] Emergency Department Provider Note  ____________________________________________   First MD Initiated Contact with Patient 03/27/19 1154     (approximate)  I have reviewed the triage vital signs and the nursing notes.   HISTORY  Chief Complaint Cough, Nasal Congestion, and Generalized Body Aches    HPI Tina Blackburn is a 48 y.o. female presents emergency department complaint of fever, body aches, cough, headache for 3 days.  No loss of taste or smell.  No vomiting or diarrhea.  No known exposure to Covid.    Past Medical History:  Diagnosis Date  . Brain tumor (benign) ([place])   . Diabetes mellitus without complication ([place])     There are no problems to display for this patient.   Past Surgical History:  Procedure Laterality Date  . CHOLECYSTECTOMY      Prior to Admission medications   Medication Sig Start Date End Date Taking? Authorizing Provider  atorvastatin (LIPITOR) 20 MG tablet Take 20 mg by mouth daily.    [provider]  cetirizine (ZYRTEC) 10 MG tablet Take 10 mg by mouth.    [provider]  insulin detemir (LEVEMIR) 100 UNIT/ML injection Inject 60 Units into the skin at bedtime.    [provider]  omeprazole (PRILOSEC) 40 MG capsule Take 40 mg by mouth daily.    [provider]  prochlorperazine (COMPAZINE) 10 MG tablet Take 1 tablet (10 mg total) by mouth every 6 (six) hours as needed for nausea. 11/28/16 03/27/19  Paulette Blanch, MD    Allergies Patient has no known allergies.  History reviewed. No pertinent family history.  Social History Social History   Tobacco Use  . Smoking status: Never Smoker  . Smokeless tobacco: Never Used  Substance Use Topics  . Alcohol use: No  . Drug use: No    Review of Systems  Constitutional: Positive fever/chills Eyes: No visual changes. ENT: Positive sore throat. Respiratory: Positive cough Genitourinary: Negative for  dysuria. Musculoskeletal: Negative for back pain. Skin: Negative for rash.    ____________________________________________   PHYSICAL EXAM:  VITAL SIGNS: ED Triage Vitals  Enc Vitals Group     BP 03/27/19 1115 116/69     Pulse Rate 03/27/19 1115 86     Resp 03/27/19 1115 20     Temp 03/27/19 1115 98.9 F (37.2 C)     Temp Source 03/27/19 1115 Oral     SpO2 03/27/19 1115 96 %     Weight 03/27/19 1118 160 lb (72.6 kg)     Height 03/27/19 1118 5\' 1"  (1.549 m)     Head Circumference --      Peak Flow --      Pain Score 03/27/19 1121 8     Pain Loc --      Pain Edu? --      Excl. in [place]? --     Constitutional: Alert and oriented. Well appearing and in no acute distress. Eyes: Conjunctivae are normal.  Head: Atraumatic. Nose: No congestion/rhinnorhea. Mouth/Throat: Mucous membranes are moist.  Throat appears normal Neck:  supple no lymphadenopathy noted Cardiovascular: Normal rate, regular rhythm. Heart sounds are normal Respiratory: Normal respiratory effort.  No retractions, lungs c t a  GU: deferred Musculoskeletal: FROM all extremities, warm and well perfused Neurologic:  Normal speech and language.  Skin:  Skin is warm, dry and intact. No rash noted. Psychiatric: Mood and affect are normal. Speech and behavior are normal.  ____________________________________________   LABS (all  labs ordered are listed, but only abnormal results are displayed)  Labs Reviewed  SARS CORONAVIRUS 2 (TAT 6-24 HRS)  INFLUENZA PANEL BY PCR (TYPE A & B)  POC SARS CORONAVIRUS 2 AG -  ED  POC SARS CORONAVIRUS 2 AG -  ED   ____________________________________________   ____________________________________________  RADIOLOGY  Chest x-ray is negative  ____________________________________________   PROCEDURES  Procedure(s) performed: No  Procedures    ____________________________________________   INITIAL IMPRESSION / ASSESSMENT AND PLAN / ED COURSE  Pertinent labs &  imaging results that were available during my care of the patient were reviewed by me and considered in my medical decision making (see chart for details).   Patient's 48 year old female presents emergency department with Covid-like symptoms.  See HPI  Physical exam shows patient appears well.  Vitals are normal.  Lungs clear to auscultation.  POC Covid test Chest x-ray   POC Covid test is negative, 6 to 24-hour Covid test ordered, chest x-ray is negative however I do see increased bronchial markings which are typical in early stages of Covid.  Explained the findings to the patient.  She is to stay quarantined until test results have been called.  If negative she may return to work when she has not had a fever for 24 to 48 hours.  If positive remain quarantined for an additional 10 days.  She states she understands will comply.  All information was conveyed by the [facility] interpreter.  She was discharged in stable condition.  Tina Blackburn was evaluated in Emergency Department on 03/27/2019 for the symptoms described in the history of present illness. She was evaluated in the context of the global COVID-19 pandemic, which necessitated consideration that the patient might be at risk for infection with the SARS-CoV-2 virus that causes COVID-19. Institutional protocols and algorithms that pertain to the evaluation of patients at risk for COVID-19 are in a state of rapid change based on information released by regulatory bodies including the CDC and federal and state organizations. These policies and algorithms were followed during the patient's care in the ED.   As part of my medical decision making, I reviewed the following data within the [facility] notes reviewed and incorporated, Interpreter needed, Labs reviewed , Old chart reviewed, Radiograph reviewed , Notes from prior ED visits and [place] Controlled Substance Database  ____________________________________________    FINAL CLINICAL IMPRESSION(S) / ED DIAGNOSES  Final diagnoses:  Suspected COVID-19 virus infection      NEW MEDICATIONS STARTED DURING THIS VISIT:  Discharge Medication List as of 03/27/2019  2:20 PM       Note:  This document was prepared using Dragon voice recognition software and may include unintentional dictation errors.    Versie Starks, PA-C 03/27/19 1502    Merlyn Lot, MD 03/27/19 [phone]

## 2019-03-27 NOTE — ED Triage Notes (Signed)
Pt arrived via POV from home with reports of headache, body aches, runny nose and cough that started 3 days ago.  Pt denies any COVID contacts.  Denies n/v/d.

## 2019-04-05 ENCOUNTER — Telehealth: Payer: Self-pay

## 2019-04-05 NOTE — Telephone Encounter (Signed)
Pt notified of negative COVID-19 results. Understanding verbalized.  Tina Blackburn   

## 2019-04-05 NOTE — Telephone Encounter (Signed)
Pt's daughter called for covid results. Advised that results are not back. Pt given [place] number. Daughter verbalized understanding.

## 2019-04-11 ENCOUNTER — Emergency Department: Admission: EM | Admit: 2019-04-11 | Discharge: 2019-04-11 | Discharge status: Against Medical Advice | Payer: Self-pay

## 2019-04-11 NOTE — ED Notes (Signed)
Pt has not returned to the lobby at this time.

## 2019-04-11 NOTE — ED Notes (Signed)
First Nurse Note: Pt visualized by this RN walking to parking lot

## 2019-04-11 NOTE — ED Notes (Signed)
Pt has not returned to the lobby at this time

## 2019-07-20 ENCOUNTER — Other Ambulatory Visit: Payer: Self-pay

## 2019-07-20 ENCOUNTER — Emergency Department
Admission: EM | Admit: 2019-07-20 | Discharge: 2019-07-20 | Disposition: A | Discharge status: Home / Self Care | Payer: Self-pay | Attending: Emergency Medicine | Admitting: Emergency Medicine

## 2019-07-20 ENCOUNTER — Emergency Department: Payer: Self-pay

## 2019-07-20 DIAGNOSIS — Z975 Presence of (intrauterine) contraceptive device: Secondary | ICD-10-CM

## 2019-07-20 DIAGNOSIS — E119 Type 2 diabetes mellitus without complications: Secondary | ICD-10-CM | POA: Insufficient documentation

## 2019-07-20 DIAGNOSIS — R102 Pelvic and perineal pain: Secondary | ICD-10-CM

## 2019-07-20 DIAGNOSIS — Z79899 Other long term (current) drug therapy: Secondary | ICD-10-CM | POA: Insufficient documentation

## 2019-07-20 DIAGNOSIS — Z794 Long term (current) use of insulin: Secondary | ICD-10-CM | POA: Insufficient documentation

## 2019-07-20 DIAGNOSIS — D259 Leiomyoma of uterus, unspecified: Secondary | ICD-10-CM | POA: Insufficient documentation

## 2019-07-20 DIAGNOSIS — Z9049 Acquired absence of other specified parts of digestive tract: Secondary | ICD-10-CM | POA: Insufficient documentation

## 2019-07-20 LAB — COMPREHENSIVE METABOLIC PANEL
ALT: 22 U/L (ref 0–44)
AST: 19 U/L (ref 15–41)
Albumin: 3.6 g/dL (ref 3.5–5.0)
Alkaline Phosphatase: 54 U/L (ref 38–126)
Anion gap: 7 (ref 5–15)
BUN: 20 mg/dL (ref 6–20)
CO2: 25 mmol/L (ref 22–32)
Calcium: 9.2 mg/dL (ref 8.9–10.3)
Chloride: 108 mmol/L (ref 98–111)
Creatinine, Ser: 0.75 mg/dL (ref 0.44–1.00)
GFR calc Af Amer: >60 mL/min (ref >60)
GFR calc non Af Amer: >60 mL/min (ref >60)
Glucose, Bld: 125 mg/dL — ABNORMAL HIGH (ref 70–99)
Potassium: 4.2 mmol/L (ref 3.5–5.1)
Sodium: 140 mmol/L (ref 135–145)
Total Bilirubin: 0.7 mg/dL (ref 0.3–1.2)
Total Protein: 6.8 g/dL (ref 6.5–8.1)

## 2019-07-20 LAB — URINALYSIS, COMPLETE (UACMP) WITH MICROSCOPIC
Bacteria, UA: NONE SEEN
Bilirubin Urine: NEGATIVE
Glucose, UA: NEGATIVE mg/dL
Hgb urine dipstick: NEGATIVE
Ketones, ur: NEGATIVE mg/dL
Leukocytes,Ua: NEGATIVE
Nitrite: NEGATIVE
Protein, ur: NEGATIVE mg/dL
Specific Gravity, Urine: 1.018 (ref 1.005–1.030)
pH: 6 (ref 5.0–8.0)

## 2019-07-20 LAB — CBC
HCT: 37.2 % (ref 36.0–46.0)
Hemoglobin: 12.3 g/dL (ref 12.0–15.0)
MCH: 29.6 pg (ref 26.0–34.0)
MCHC: 33.1 g/dL (ref 30.0–36.0)
MCV: 89.4 fL (ref 80.0–100.0)
Platelets: 240 10*3/uL (ref 150–400)
RBC: 4.16 MIL/uL (ref 3.87–5.11)
RDW: 13.2 % (ref 11.5–15.5)
WBC: 6.2 10*3/uL (ref 4.0–10.5)
nRBC: 0 % (ref 0.0–0.2)

## 2019-07-20 LAB — PREGNANCY, URINE: Preg Test, Ur: NEGATIVE

## 2019-07-20 LAB — LIPASE, BLOOD: Lipase: 39 U/L (ref 11–51)

## 2019-07-20 MED ORDER — SODIUM CHLORIDE 0.9% FLUSH: 3.0000 mL | Freq: Once | INTRAVENOUS | Status: DC

## 2019-07-20 MED ORDER — KETOROLAC TROMETHAMINE 30 MG/ML IJ SOLN
30.0000 mg | Freq: Once | INTRAMUSCULAR | Status: AC
  Administered 2019-07-20: 30 mg via INTRAMUSCULAR
  Filled 2019-07-20: qty 1

## 2019-07-20 NOTE — ED Triage Notes (Signed)
Pt c/o pain and vaginal bleeding since IUD placement 3 weeks ago.

## 2019-07-20 NOTE — ED Notes (Signed)
See triage note  States she has had some vaginal bleed ing and pain  States sx's started about 3 weeks ago

## 2019-07-20 NOTE — ED Provider Notes (Signed)
[facility] Emergency Department Provider Note   ____________________________________________    I have reviewed the triage vital signs and the nursing notes.   HISTORY  Chief Complaint Abdominal Pain     HPI Tina Blackburn is a 49 y.o. female with a history of diabetes who presents with complaints of abdominal pain, pelvic cramping and vaginal bleeding.  Patient reports that she has a long history of heavy vaginal bleeding, had Mirena IUD inserted 3 weeks ago at the recommendation of her PCP to see if this would help with her bleeding.  She reports she has continued to have bleeding as well as increased pain and cramping.  Denies fevers or chills.  No nausea or vomiting.  Is not take anything for this.  Has not seen gynecology.  Past Medical History:  Diagnosis Date  . Brain tumor (benign) ([place])   . Diabetes mellitus without complication ([place])     There are no problems to display for this patient.   Past Surgical History:  Procedure Laterality Date  . CHOLECYSTECTOMY      Prior to Admission medications   Medication Sig Start Date End Date Taking? Authorizing Provider  atorvastatin (LIPITOR) 20 MG tablet Take 20 mg by mouth daily.    [provider]  cetirizine (ZYRTEC) 10 MG tablet Take 10 mg by mouth.    [provider]  insulin detemir (LEVEMIR) 100 UNIT/ML injection Inject 60 Units into the skin at bedtime.    [provider]  omeprazole (PRILOSEC) 40 MG capsule Take 40 mg by mouth daily.    [provider]  prochlorperazine (COMPAZINE) 10 MG tablet Take 1 tablet (10 mg total) by mouth every 6 (six) hours as needed for nausea. 11/28/16 03/27/19  Paulette Blanch, MD     Allergies Patient has no known allergies.  No family history on file.  Social History Social History   Tobacco Use  . Smoking status: Never Smoker  . Smokeless tobacco: Never Used  Substance Use Topics  . Alcohol use: No  .  Drug use: No    Review of Systems  Constitutional: No fever/chills Eyes: No visual changes.  ENT: No sore throat. Cardiovascular: Denies chest pain. Respiratory: Denies shortness of breath. Gastrointestinal: As above Genitourinary: Negative for dysuria.  Vaginal bleeding as above Musculoskeletal: Negative for back pain. Skin: Negative for rash. Neurological: Negative for headaches or weakness   ____________________________________________   PHYSICAL EXAM:  VITAL SIGNS: ED Triage Vitals  Enc Vitals Group     BP 07/20/19 1116 (!) 109/56     Pulse Rate 07/20/19 1116 73     Resp 07/20/19 1116 20     Temp 07/20/19 1116 98.2 F (36.8 C)     Temp Source 07/20/19 1116 Oral     SpO2 07/20/19 1116 100 %     Weight 07/20/19 1119 73.5 kg (162 lb)     Height 07/20/19 1119 1.575 m (5\' 2" )     Head Circumference --      Peak Flow --      Pain Score 07/20/19 1119 6     Pain Loc --      Pain Edu? --      Excl. in [place]? --     Constitutional: Alert and oriented.   Nose: No congestion/rhinnorhea. Mouth/Throat: Mucous membranes are moist.    Cardiovascular: Normal rate, regular rhythm. Grossly normal heart sounds.   Respiratory: Normal respiratory effort.  No retractions. Lungs CTAB. Gastrointestinal: Soft  and nontender. No distention.   Musculoskeletal: Warm and well perfused Neurologic:  Normal speech and language. No gross focal neurologic deficits are appreciated.  Skin:  Skin is warm, dry and intact. No rash noted. Psychiatric: Mood and affect are normal. Speech and behavior are normal.  ____________________________________________   LABS (all labs ordered are listed, but only abnormal results are displayed)  Labs Reviewed  COMPREHENSIVE METABOLIC PANEL - Abnormal; Notable for the following components:      Result Value   Glucose, Bld 125 (*)    All other components within normal limits  URINALYSIS, COMPLETE (UACMP) WITH MICROSCOPIC - Abnormal; Notable for the  following components:   Color, Urine YELLOW (*)    APPearance CLEAR (*)    All other components within normal limits  LIPASE, BLOOD  CBC  PREGNANCY, URINE   ____________________________________________  EKG  None ____________________________________________  RADIOLOGY  Ultrasound demonstrates likely fibroid IUD in appropriate position ____________________________________________   PROCEDURES  Procedure(s) performed: No  Procedures   Critical Care performed: No ____________________________________________   INITIAL IMPRESSION / ASSESSMENT AND PLAN / ED COURSE  Pertinent labs & imaging results that were available during my care of the patient were reviewed by me and considered in my medical decision making (see chart for details).  Patient presents with continued vaginal bleeding and cramping since having IUD placed, she is concerned that it may be mispositioned.  Blood work is reassuring, hemoglobin is stable.  Abdominal exam is reassuring.  Patient treated with IM Toradol with significant improvement in cramping and pain.  Ultrasound performed which demonstrates appropriate IUD positioning but likely fibroids as the cause of her bleeding.  I have referred her to gynecology for further evaluation and treatment as necessary.    ____________________________________________   FINAL CLINICAL IMPRESSION(S) / ED DIAGNOSES  Final diagnoses:  Pelvic pain  Uterine leiomyoma, unspecified location        Note:  This document was prepared using Dragon voice recognition software and may include unintentional dictation errors.   Lavonia Drafts, MD 07/20/19 1725

## 2020-01-02 ENCOUNTER — Emergency Department: Admission: EM | Admit: 2020-01-02 | Discharge: 2020-01-02 | Discharge status: Against Medical Advice | Payer: Self-pay
